# Patient Record
Sex: Male | Born: 1970 | Race: White | Hispanic: No | Marital: Married | Smoking: Never smoker
Health system: Southern US, Community
[De-identification: ages and names within clinical notes are randomized; demographics above are authoritative.]

## PROBLEM LIST (undated history)

## (undated) DIAGNOSIS — K429 Umbilical hernia without obstruction or gangrene: Secondary | ICD-10-CM

## (undated) DIAGNOSIS — F329 Major depressive disorder, single episode, unspecified: Secondary | ICD-10-CM

## (undated) DIAGNOSIS — I10 Essential (primary) hypertension: Secondary | ICD-10-CM

## (undated) DIAGNOSIS — K759 Inflammatory liver disease, unspecified: Secondary | ICD-10-CM

## (undated) DIAGNOSIS — K219 Gastro-esophageal reflux disease without esophagitis: Secondary | ICD-10-CM

## (undated) DIAGNOSIS — K449 Diaphragmatic hernia without obstruction or gangrene: Secondary | ICD-10-CM

## (undated) DIAGNOSIS — F419 Anxiety disorder, unspecified: Secondary | ICD-10-CM

## (undated) DIAGNOSIS — F32A Depression, unspecified: Secondary | ICD-10-CM

## (undated) HISTORY — PX: UMBILICAL HERNIA REPAIR: SHX196

---

## 2018-02-02 DIAGNOSIS — F32A Depression, unspecified: Secondary | ICD-10-CM | POA: Insufficient documentation

## 2018-02-17 DIAGNOSIS — Z0000 Encounter for general adult medical examination without abnormal findings: Secondary | ICD-10-CM | POA: Insufficient documentation

## 2018-06-19 ENCOUNTER — Encounter: Payer: Self-pay | Admitting: Emergency Medicine

## 2018-06-19 ENCOUNTER — Other Ambulatory Visit: Payer: Self-pay

## 2018-06-19 ENCOUNTER — Emergency Department
Admission: EM | Admit: 2018-06-19 | Discharge: 2018-06-19 | Disposition: A | Discharge status: Home / Self Care | Payer: BC Managed Care – PPO | Attending: Emergency Medicine | Admitting: Emergency Medicine

## 2018-06-19 DIAGNOSIS — B0052 Herpesviral keratitis: Secondary | ICD-10-CM | POA: Insufficient documentation

## 2018-06-19 DIAGNOSIS — H5711 Ocular pain, right eye: Secondary | ICD-10-CM

## 2018-06-19 HISTORY — DX: Major depressive disorder, single episode, unspecified: F32.9

## 2018-06-19 HISTORY — DX: Anxiety disorder, unspecified: F41.9

## 2018-06-19 HISTORY — DX: Depression, unspecified: F32.A

## 2018-06-19 MED ORDER — CIPROFLOXACIN HCL 0.3 % OP SOLN: 1.0000 [drp] | OPHTHALMIC | 0 refills | Status: AC

## 2018-06-19 MED ORDER — TETRACAINE HCL 0.5 % OP SOLN
2.0000 [drp] | Freq: Once | OPHTHALMIC | Status: AC
  Administered 2018-06-19: 2 [drp] via OPHTHALMIC
  Filled 2018-06-19: qty 4

## 2018-06-19 MED ORDER — FLUORESCEIN SODIUM 1 MG OP STRP
1.0000 | ORAL_STRIP | Freq: Once | OPHTHALMIC | Status: AC
  Administered 2018-06-19: 1 via OPHTHALMIC
  Filled 2018-06-19: qty 1

## 2018-06-19 NOTE — ED Notes (Signed)
Pt reports some pain behind his right eye that started on yesterday. Usually wears contacts, but is wearing his glasses today.

## 2018-06-19 NOTE — ED Provider Notes (Signed)
[facility] Emergency Department Provider Note  ____________________________________________  Time seen: Approximately 8:47 PM  I have reviewed the triage vital signs and the nursing notes.   HISTORY  Chief Complaint Eye Pain    HPI Nathan Moody is a 47 y.o. male who presents the emergency department complaining of right eye pain.  Patient reports that yesterday he had a sensation of a burning that he thought was related to his contacts.  Patient took out his contacts started wearing his glasses.  Patient reports that the pain is progressed to include his whole eye as well as "behind it."  Patient denies any vision changes to include loss of vision or blurred vision.  Patient has had no erythema or drainage from the eye.  Patient does have a history of migraines and states that the pain is similar, but he is never had one behind his eyes.  No headache, visual changes, nasal congestion, sinus pressure, URI symptoms.    Past Medical History:  Diagnosis Date  . Anxiety   . Depression     There are no active problems to display for this patient.   History reviewed. No pertinent surgical history.  Prior to Admission medications   Medication Sig Start Date End Date Taking? Authorizing Provider  ciprofloxacin (CILOXAN) 0.3 % ophthalmic solution Place 1 drop into the right eye every 2 (two) hours for 5 days. Administer 1 drop, every 2 hours, while awake, for 2 days. Then 1 drop, every 4 hours, while awake, for the next 5 days. 06/19/18 06/24/18  Christian Treadway, Charline Bills, PA-C    Allergies Patient has no known allergies.  History reviewed. No pertinent family history.  Social History Social History   Tobacco Use  . Smoking status: Never Smoker  . Smokeless tobacco: Never Used  Substance Use Topics  . Alcohol use: Never    Frequency: Never  . Drug use: Never     Review of Systems  Constitutional: No fever/chills Eyes: No visual changes. No  discharge.  Positive for right eye pain ENT: No upper respiratory complaints. Cardiovascular: no chest pain. Respiratory: no cough. No SOB. Gastrointestinal: No abdominal pain.  No nausea, no vomiting.   Musculoskeletal: Negative for musculoskeletal pain. Skin: Negative for rash, abrasions, lacerations, ecchymosis. Neurological: Negative for headaches, focal weakness or numbness. 10-point ROS otherwise negative.  ____________________________________________   PHYSICAL EXAM:  VITAL SIGNS: ED Triage Vitals  Enc Vitals Group     BP 06/19/18 1946 132/88     Pulse Rate 06/19/18 1946 99     Resp 06/19/18 1946 18     Temp 06/19/18 1946 98.8 F (37.1 C)     Temp Source 06/19/18 1946 Oral     SpO2 06/19/18 1946 99 %     Weight 06/19/18 1946 210 lb (95.3 kg)     Height 06/19/18 1946 6' 1"  (1.854 m)     Head Circumference --      Peak Flow --      Pain Score 06/19/18 1951 9     Pain Loc --      Pain Edu? --      Excl. in [place]? --      Constitutional: Alert and oriented. Well appearing and in no acute distress. Eyes: Conjunctivae are normal. PERRL. EOMI. funduscopic exam bilaterally reveals good red reflex bilaterally.  Vasculature and optic disc is appreciated without acute abnormality.  No indication of retinal detachment.  Eye is anesthetized using tetracaine drops.  Fluorescein staining applied with small  area of uptake in the 9 o'clock position and 4 o'clock position.  Tono-Pen readings are obtained.  Readings are measured superiorly, medially, inferiorly, laterally respectively.  Readings for the right eye are as follows: 20, 9, 14, 17.  Readings for the left eye are as follows: 27, 13, 17, 19 Head: Atraumatic. ENT:      Ears:       Nose: No congestion/rhinnorhea.      Mouth/Throat: Mucous membranes are moist.  Neck: No stridor.    Cardiovascular: Normal rate, regular rhythm. Normal S1 and S2.  Good peripheral circulation. Respiratory: Normal respiratory effort without tachypnea  or retractions. Lungs CTAB. Good air entry to the bases with no decreased or absent breath sounds. Musculoskeletal: Full range of motion to all extremities. No gross deformities appreciated. Neurologic:  Normal speech and language. No gross focal neurologic deficits are appreciated.  Skin:  Skin is warm, dry and intact. No rash noted. Psychiatric: Mood and affect are normal. Speech and behavior are normal. Patient exhibits appropriate insight and judgement.   ____________________________________________   LABS (all labs ordered are listed, but only abnormal results are displayed)  Labs Reviewed - No data to display ____________________________________________  EKG   ____________________________________________  RADIOLOGY   No results found.  ____________________________________________    PROCEDURES  Procedure(s) performed:    Procedures    Medications  fluorescein ophthalmic strip 1 strip (has no administration in time range)  tetracaine (PONTOCAINE) 0.5 % ophthalmic solution 2 drop (has no administration in time range)     ____________________________________________   INITIAL IMPRESSION / ASSESSMENT AND PLAN / ED COURSE  Pertinent labs & imaging results that were available during my care of the patient were reviewed by me and considered in my medical decision making (see chart for details).  Review of the [place] CSRS was performed in accordance of the [place] prior to dispensing any controlled drugs.      Patient's diagnosis is consistent with right eye pain/dendritic ulcer.  Patient presents emergency department complaining of right eye pain.  Initially, patient thought they were related to his contacts.  He removed these.  Pain continued.  Differential included corneal abrasion, dendritic ulcer, bacterial conjunctivitis, retinal detachment, vitreous humor detachment, optic nerve injury, optic nerve stroke, ocular migraine.  Patient has mild areas of uptake on the  eye concerning for dendritic ulcer given contact use.  In addition, I suspect that symptoms are likely ocular migraine related.  Patient will be given antibiotic eyedrops.  He is encouraged to use medications that he would normally use for migraines at home.  If symptoms change, worsen he is to return to the emergency department.  If symptoms do not improve with medications, he is to follow-up with ophthalmology..  Patient is given ED precautions to return to the ED for any worsening or new symptoms.     ____________________________________________  FINAL CLINICAL IMPRESSION(S) / ED DIAGNOSES  Final diagnoses:  Dendritic ulcer  Pain of right eye      NEW MEDICATIONS STARTED DURING THIS VISIT:  ED Discharge Orders         Ordered    ciprofloxacin (CILOXAN) 0.3 % ophthalmic solution  Every 2 hours     06/19/18 2053              This chart was dictated using voice recognition software/Dragon. Despite best efforts to proofread, errors can occur which can change the meaning. Any change was purely unintentional.    Darletta Moll, PA-C 06/19/18 2055  Nance Pear, MD 06/19/18 2104

## 2018-06-19 NOTE — ED Triage Notes (Signed)
Pt arrived to the ED from home for complaints of right eye pain. Pt states that he uses contact lenses and he believed that the pain was related to the contact lenses so he removed them and started to use his glasses without relief. Pt reports that the pain has gotten increasingly worse without vision changes. Pt is AOx4 in no apparent distress with no other accompanied symptoms during triage.

## 2018-06-30 DIAGNOSIS — E663 Overweight: Secondary | ICD-10-CM | POA: Insufficient documentation

## 2018-07-03 DIAGNOSIS — E782 Mixed hyperlipidemia: Secondary | ICD-10-CM | POA: Insufficient documentation

## 2018-09-29 DIAGNOSIS — F5104 Psychophysiologic insomnia: Secondary | ICD-10-CM | POA: Insufficient documentation

## 2019-01-08 DIAGNOSIS — K429 Umbilical hernia without obstruction or gangrene: Secondary | ICD-10-CM | POA: Insufficient documentation

## 2020-10-21 HISTORY — PX: UMBILICAL HERNIA REPAIR: SHX196

## 2021-04-27 ENCOUNTER — Ambulatory Visit (INDEPENDENT_AMBULATORY_CARE_PROVIDER_SITE_OTHER): Payer: BC Managed Care – PPO | Admitting: Surgery

## 2021-04-27 ENCOUNTER — Other Ambulatory Visit: Payer: Self-pay

## 2021-04-27 ENCOUNTER — Encounter: Payer: Self-pay | Admitting: Surgery

## 2021-04-27 VITALS — BP 129/94 | HR 76 | Temp 98.2°F | Ht 73.0 in | Wt 209.2 lb

## 2021-04-27 DIAGNOSIS — K449 Diaphragmatic hernia without obstruction or gangrene: Secondary | ICD-10-CM

## 2021-04-27 DIAGNOSIS — K439 Ventral hernia without obstruction or gangrene: Secondary | ICD-10-CM

## 2021-04-27 DIAGNOSIS — R1084 Generalized abdominal pain: Secondary | ICD-10-CM | POA: Diagnosis not present

## 2021-04-27 NOTE — Consult Note (Addendum)
Patient ID: Nathan Moody, male   DOB: 23-Sep-1970, 50 y.o.   MRN: [id number]  HPI Ferlando Poythress is a 50 y.o. male patient seen in consultation at the request of Dr. Lysle Pearl for recalcitrant reflux, hiatal hernia and recurrent incisional hernia. Reports that over the last few months he has began reflux symptoms.  This gets worse when he lays supine, alleviated by some PPI.  He denies any dysphagia.  About 6 months ago he had laparoscopic ventral hernia repair (epigastric and umbilical primary repair) by Dr. Marian Sorrow at [facility].  He also reports that shortly after his hernia surgery recurrent symptoms with abdominal wall pain as well bulging sensation. He was recently hospitalized for about a week at [facility] due to suicidal ideation and depression. I have personally reviewed the operative report that the hernia was fixed with Vicryl laparoscopically suture, no mesh used. ( Pt had was hesitant). Endorses intermittent pain, sharp mild-moderate. Pain worsening w valsalva So far he has not had endoscopic evaluation.  He did have a chest x-ray showing evidence of a hiatal hernia. He does have chronic cough. He is able to perform more than 4 METS of activity without any shortness of or chest pain. Currently denies any suicidal ideation. He drinks a couple beers per week.  Denies any drugs.  Recent CBC and CMP was completely normal   HPI  Past Medical History:  Diagnosis Date   Anxiety    Depression     Past Surgical History:  Procedure Laterality Date   UMBILICAL HERNIA REPAIR      History reviewed. No pertinent family history.  Social History Social History   Tobacco Use   Smoking status: Never   Smokeless tobacco: Never  Vaping Use   Vaping Use: Never used  Substance Use Topics   Alcohol use: Never   Drug use: Never    No Known Allergies  Current Outpatient Medications  Medication Sig Dispense Refill   buPROPion (WELLBUTRIN SR) 150 MG 12 hr tablet Take 150 mg by mouth 2 (two) times  daily.     busPIRone (BUSPAR) 5 MG tablet Take 5 mg by mouth 3 (three) times daily.     mirtazapine (REMERON) 30 MG tablet Take 30 mg by mouth at bedtime.     valACYclovir (VALTREX) 1000 MG tablet Take 1,000 mg by mouth 2 (two) times daily.     venlafaxine (EFFEXOR) 37.5 MG tablet Take 37.5 mg by mouth 2 (two) times daily.     No current facility-administered medications for this visit.     Review of Systems Full ROS  was asked and was negative except for the information on the HPI  Physical Exam Blood pressure (!) 129/94, pulse 76, temperature 98.2 F (36.8 C), temperature source Oral, height 6' 1"  (1.854 m), weight 209 lb 3.2 oz (94.9 kg), SpO2 98 %. CONSTITUTIONAL: NAD. EYES: Pupils are equal, round, , Sclera are non-icteric. EARS, NOSE, MOUTH AND THROAT: The oropharynx is clear. THe is wearing a mask, Hearing is intact to voice. LYMPH NODES:  Lymph nodes in the neck are normal. RESPIRATORY:  Lungs are clear. There is normal respiratory effort, with equal breath sounds bilaterally, and without pathologic use of accessory muscles. CARDIOVASCULAR: Heart is regular without murmurs, gallops, or rubs. GI: The abdomen is  soft, There is both epigastric hernia and umbilical hernias, recurrent and tender to palpation, reducible. There are no palpable masses. There is no hepatosplenomegaly. There are normal bowel sounds GU: Rectal deferred.   MUSCULOSKELETAL: Normal muscle strength  and tone. No cyanosis or edema.   SKIN: Turgor is good and there are no pathologic skin lesions or ulcers. NEUROLOGIC: Motor and sensation is grossly normal. Cranial nerves are grossly intact. PSYCH:  Oriented to person, place and time. Affect is normal.  Data Reviewed  I have personally reviewed the patient's imaging, laboratory findings and medical records.    Assessment/Plan 50 year old male with recalcitrant reflux associated with hiatal hernia.  We will start work-up to include a CT scan of the abdomen  pelvis and as well as endoscopic evaluation.  We will asked Dr. Lysle Pearl to complete endoscopic evaluation in the form of upper scopes. In addition to this CT does have a recurrent epigastric and periumbilical ventral hernias that are symptomatic and will need to be addressed at some point time.  I would like to wait for pertinent imaging studies to evaluate the extent of abdominal wall hernias and whether or not he will be a feasible option to address the 3 hernias during the same operative setting. Extensive discussion with patient regarding his disease process and also my thought process was held. A copy of this report was sent to the referring provider  Time spent in this encounter was > 60 minutes including personally reviewing the operative reports, personally reviewing imaging studies, ordering test, counseling the patient and performing appropriate documentation  Caroleen Hamman, MD FACS General Surgeon 04/27/2021, 11:22 AM

## 2021-04-27 NOTE — Patient Instructions (Addendum)
We will refer you back to Dr Lysle Pearl to have an Upper Endoscopy done, he can also do the Colonoscopy at that time. They will call you about this.   We will get you scheduled for a CT scan of your abdomen and a swallow study done to better assess your hernias.  We will have you return here to discuss the next steps once we have all of your results.    You are scheduled for a Barium Swallow study at [facility] on 05/04/21. You will need to have nothing to eat or drink for 3 hours prior to the test. You arrival time is at 7:45 am at the [facility] entrance.   You are scheduled for a CT scan with contrast at [facility] on November 23rd at 11:30 am. You will need to have nothing to eat or drink for 4 hours prior. You need to arrive by there by 11:15 am You will need to pick up a prep kit for this test-you may get it there.    We have spoken today about repairing your Hiatal Hernia.  Plan to be in the hospital for 1-3 days if the minimally invasive surgery is completed without having to make a bigger incision. If the bigger incision is made, you will most likely need to be in the hospital 5-6 days. You will be on a liquid diet and need to recover for 1-2 weeks following your surgery prior to doing any of your normal activities. At the 2 week mark, we will see you in the office and if you are doing ok we will advance your diet and activity level as you tolerate.    Laparoscopic Nissen Fundoplication Laparoscopic Nissen fundoplication is surgery to relieve heartburn and other problems caused by gastric fluids flowing up into your esophagus. The esophagus is the tube that carries food and liquid from your throat to your stomach. Normally, the muscle that sits between your stomach and your esophagus (lower esophageal sphincter or LES) keeps stomach fluids in your stomach. In some people, the LES does not work properly, and stomach fluids flow up into the esophagus. This can happen when part of  the stomach bulges through the LES (hiatal hernia). The backward flow of stomach fluids can cause a type of severe and long-standing heartburn that is called gastroesophageal reflux disease (GERD). You may need this surgery if other treatments for GERD have not helped. In a laparoscopic Nissen fundoplication, the upper part of your stomach is wrapped around the lower part of your esophagus to strengthen the LES and prevent reflux. If you have a hiatal hernia, it will also be repaired with this surgery. The procedure is done through several small incisions in your abdomen. It is performed using a thin, telescopic instrument (laparoscope) and other instruments that can pass through the scope or through other small incisions. Tell a health care provider about: Any allergies you have. All medicines you are taking, including vitamins, herbs, eye drops, creams, and over-the-counter medicines. Any problems you or family members have had with anesthetic medicines. Any blood disorders you have. Any surgeries you have had. Any medical conditions you have. What are the risks? Generally, this is a safe procedure. However, problems may occur, including: Difficulty swallowing (dysphagia). Bloating. Nausea or vomiting. Damage to the lung, causing a collapsed lung. Infection or bleeding. What happens before the procedure? Ask your health care provider about: Changing or stopping your regular medicines. This is especially important if you are taking diabetes medicines  or blood thinners. Taking medicines such as aspirin and ibuprofen. These medicines can thin your blood. Do not take these medicines before your procedure if your health care provider asks you not to. Follow your health care provider's instructions about eating or drinking restrictions. Plan to have someone take you home after the procedure. What happens during the procedure? An IV tube will be inserted into one of your veins. It will be used to  give you fluids and medicines during the procedure. You will be given a medicine that makes you fall asleep (general anesthetic). Your abdomen will be cleaned with a germ-killing solution (antiseptic). The surgeon will make a small incision in your abdomen and insert a tube through the incision. Your abdomen will be filled with a gas. This helps the surgeon to see your organs more easily and it makes more space to work. The surgeon will insert the laparoscope through the incision. The scope has a camera that will send pictures to a monitor in the operating room. The surgeon will make several other small incisions in your abdomen to insert the other instruments that are needed during the procedure. Another instrument (dilator) will be passed through your mouth and down your esophagus into the upper part of your stomach. The dilator will prevent your LES from being closed too tightly during surgery. The surgeon will pass the top portion of your stomach behind the lower part of your esophagus and wrap it all the way around. This will be stitched into place. If you have a hiatal hernia, it will be repaired during this procedure. All instruments will be removed, and your incisions will be closed under your skin with stitches (sutures). Skin adhesive strips may also be used. A bandage (dressing) will be placed on your skin over the incisions. The procedure may vary among health care providers and hospitals. What happens after the procedure? You will be moved to a recovery area. Your blood pressure, heart rate, breathing rate, and blood oxygen level will be monitored often until the medicines you were given have worn off. You will be given pain medicine as needed. Your IV tube will be kept in until you are able to drink fluids. This information is not intended to replace advice given to you by your health care provider. Make sure you discuss any questions you have with your health care provider. Document  Released: 07/05/2014 Document Revised: 11/20/2015 Document Reviewed: 02/13/2014 Elsevier Interactive Patient Education  2017 [employer].

## 2021-05-04 ENCOUNTER — Other Ambulatory Visit: Payer: Self-pay | Admitting: Surgery

## 2021-05-04 ENCOUNTER — Ambulatory Visit
Admission: RE | Admit: 2021-05-04 | Discharge: 2021-05-04 | Disposition: A | Discharge status: Home / Self Care | Payer: BC Managed Care – PPO | Source: Ambulatory Visit | Attending: Surgery | Admitting: Surgery

## 2021-05-04 DIAGNOSIS — K449 Diaphragmatic hernia without obstruction or gangrene: Secondary | ICD-10-CM

## 2021-05-20 ENCOUNTER — Ambulatory Visit
Admission: RE | Admit: 2021-05-20 | Discharge: 2021-05-20 | Disposition: A | Discharge status: Home / Self Care | Payer: BC Managed Care – PPO | Source: Ambulatory Visit | Attending: Surgery | Admitting: Surgery

## 2021-05-20 ENCOUNTER — Other Ambulatory Visit: Payer: Self-pay

## 2021-05-20 DIAGNOSIS — K449 Diaphragmatic hernia without obstruction or gangrene: Secondary | ICD-10-CM | POA: Diagnosis present

## 2021-05-20 DIAGNOSIS — K439 Ventral hernia without obstruction or gangrene: Secondary | ICD-10-CM | POA: Diagnosis present

## 2021-05-20 DIAGNOSIS — R1084 Generalized abdominal pain: Secondary | ICD-10-CM | POA: Diagnosis present

## 2021-05-20 MED ORDER — IOHEXOL 300 MG/ML  SOLN
100.0000 mL | Freq: Once | INTRAMUSCULAR | Status: AC | PRN
  Administered 2021-05-20: 100 mL via INTRAVENOUS

## 2021-05-25 ENCOUNTER — Telehealth: Payer: Self-pay

## 2021-05-25 ENCOUNTER — Ambulatory Visit: Payer: BC Managed Care – PPO | Admitting: Surgery

## 2021-05-25 NOTE — Telephone Encounter (Signed)
Spoke with patient regarding CT results and reminded to keep follow up appointment with Dr.Pabon.

## 2021-05-26 DIAGNOSIS — I10 Essential (primary) hypertension: Secondary | ICD-10-CM | POA: Insufficient documentation

## 2021-05-27 ENCOUNTER — Ambulatory Visit (INDEPENDENT_AMBULATORY_CARE_PROVIDER_SITE_OTHER): Payer: BC Managed Care – PPO | Admitting: Surgery

## 2021-05-27 ENCOUNTER — Encounter: Payer: Self-pay | Admitting: Surgery

## 2021-05-27 ENCOUNTER — Other Ambulatory Visit: Payer: Self-pay

## 2021-05-27 VITALS — BP 142/97 | HR 86 | Temp 98.0°F | Ht 73.0 in | Wt 231.0 lb

## 2021-05-27 DIAGNOSIS — K449 Diaphragmatic hernia without obstruction or gangrene: Secondary | ICD-10-CM | POA: Diagnosis not present

## 2021-05-27 DIAGNOSIS — K432 Incisional hernia without obstruction or gangrene: Secondary | ICD-10-CM

## 2021-05-27 NOTE — Patient Instructions (Addendum)
Our surgery scheduler will call you within 24-48 hours to schedule your surgery. Please have the [facility] surgery sheet available when speaking with her. No bread or Carbonation for 4 weeks.  Eating Plan After Nissen Fundoplication After a Nissen fundoplication procedure, it is common to have some difficulty swallowing. The part of your body that moves food and liquid from your mouth to your stomach (esophagus) will be swollen and may feel tight. It will take several weeks or months for your esophagus and stomach to heal. By following a special eating plan, you can prevent problems such as pain, swelling or pressure in the abdomen (bloating), gas, nausea, or diarrhea. What are tips for following this plan? Cooking Cook all foods until they are soft. Remove skins and seeds from fruits and vegetables before eating. Remove skin and gristle from meats. Grind or finely mince meats before eating. Avoid over-cooking meat. Dry, tough meat is more difficult to swallow. Avoid using oil when cooking, or use only a small amount of oil. Avoid using seasoning when cooking, or use only a small amount of seasoning. Toast bread before eating. This makes it easier to swallow. Meal planning  Eat 6-8 small meals throughout the day. Right after the surgery, have a few meals that are only clear liquids. Clear liquids include: Water. Clear fruit juice, no pulp. Chicken, beef, or vegetable broth. Gelatin. Decaffeinated tea or coffee without milk. Popsicles or shaved ice. Depending on your progress, you may move to a full liquid diet as told by your health care provider. This includes clear liquids and the following: Dairy and alternative milks, such as soy milk. Strained creamed soups. Ice cream or sherbet. Pudding. Nutritional supplement drinks. Yogurt. A few days after surgery, you may be able to start eating a diet of soft foods. You may need to eat according to this plan for several weeks. Do not eat sweets  or sweetened drinks at the beginning of a meal. Doing that may cause your stomach to empty faster than it should (dumping syndrome). Lifestyle Always sit upright when eating or drinking. Eat slowly. Take small bites and chew food well before swallowing. Do not lie down after eating. Stay sitting up for 30 minutes or longer after each meal. Sip fluids between meals. Limit how much you drink at one time. With meals and snacks, have 4-8 oz (120-240 mL). This is equal to  cup-1 cup. Do not mix solid foods and liquids in the same mouthful. Drink enough fluid to keep your urine pale yellow. Do not chew gum or drink fluids through a straw. Doing those things may cause you to swallow extra air. General information Do not drink carbonated drinks or alcohol. Avoid foods and drinks that contain caffeine and chocolate. Avoid foods and drinks that contain citrus or tomato. Allow hot soups and drinks to cool before eating. Avoid foods that cause gas, such as beans, peas, broccoli, or cabbage. If dairy milk products cause diarrhea, avoid them or eat them in small amounts. Recommended foods Fruits Any soft-cooked fruits after skins and seeds are removed. Fruit juice. Vegetables Any soft-cooked vegetables after skins and seeds are removed. Vegetable juice. Grains Cooked cereals. Dry cereals softened with liquid. Cooked pasta, rice, or other grains. Toasted bread. Bland crackers, such as soda or graham crackers. Meats and other protein foods Tender cuts of meat, poultry, or fish after bones, skin, and gristle are removed. Poached, boiled, or scrambled eggs. Canned fish. Tofu. Creamy nut butters. Dairy Milk. Yogurt. Cottage cheese. Mild  cheeses. Beverages Nutritional supplement drinks. Decaffeinated tea or coffee. Sports drinks. Fats and oils Butter. Margarine. Mayonnaise. Vegetable oil. Smooth salad dressing. Sweets and desserts Plain hard candy. Marshmallows. Pudding. Ice cream. Gelatin.  Sherbet. Seasoning and other foods Salt. Light seasonings. Mustard. Vinegar. The items listed above may not be a complete list of recommended foods and beverages. Contact a dietitian for more information. Foods to avoid Fruits Oranges. Grapefruit. Lemons. Limes. Citrus juices. Dried fruit. Crunchy, raw fruits. Vegetables Tomato sauce. Tomato juice. Broccoli. Cauliflower. Cabbage. Brussels sprouts. Crunchy, raw vegetables. Grains High-fiber or bran cereal. Cereal with nuts, dried fruit, or coconut. Sweet breads, rolls, coffee cake, or donuts. Chewy or crusty breads. Popcorn. Meats and other protein foods Beans, peas, and lentils. Tough or fatty meats. Fried meats, chicken, or fish. Fried eggs. Nuts and seeds. Crunchy nut butters. Dairy Chocolate milk. Yogurt with chunks of fruit, nuts, seeds, or coconut. Strong cheeses. Beverages Carbonated soft drinks. Alcohol. Cocoa. Hot drinks. Fats and oils Bacon fat. Lard. Sweets and desserts Chocolate. Candy with nuts, coconut, or seeds. Peppermint. Cookies. Cakes. Pie crust. Seasoning and other foods Heavy seasonings. Chili sauce. Ketchup. Barbecue sauce. Angie Fava. Horseradish. The items listed above may not be a complete list of foods and beverages to avoid. Contact a dietitian for more information. Summary Following this eating plan after a Nissen fundoplication is an important part of healing after surgery. After surgery, you will start with a clear liquid diet before you progress to full liquids and soft foods. You may need to eat soft foods for several weeks. Avoid eating foods that cause irritation, gas, nausea, diarrhea, or swelling or pressure in the abdomen (bloating), and avoid foods that are difficult to swallow. Talk with a dietitian about which dietary choices are best for you. This information is not intended to replace advice given to you by your health care provider. Make sure you discuss any questions you have with your health care  provider. Document Revised: 12/30/2019 Document Reviewed: 12/30/2019 Elsevier Patient Education  2022 [place], Adult   A hernia is the bulging of an organ or tissue through a weak spot in the muscles of the abdomen. Hernias develop most often near the belly button (navel) or the area where the leg meets the lower abdomen (groin). Common types of hernias include: Incisional hernia. This type bulges through a scar from an abdominal surgery. Umbilical hernia. This type develops near the navel. Inguinal hernia. This type develops in the groin or scrotum. Femoral hernia. This type develops below the groin, in the upper thigh area. Hiatal hernia. This type occurs when part of the stomach slides above the muscle that separates the abdomen from the chest (diaphragm). What are the causes? This condition may be caused by: Heavy lifting. Coughing over a long period of time. Straining to have a bowel movement. Constipation can lead to straining. An incision made during abdominal surgery. A physical problem that is present at birth (congenital defect). Being overweight or obese. Smoking. Excess fluid in the abdomen. Undescended testicles in males. What are the signs or symptoms? The main symptom is a skin-colored, rounded bulge in the area of the hernia. However, a bulge may not always be present. It may grow bigger or be more visible when you cough or strain (such as when lifting something heavy). A hernia that can be pushed back into the abdomen (is reducible) rarely causes pain. A hernia that cannot be pushed back into the abdomen (is incarcerated) may lose its blood supply (  become strangulated). A hernia that is incarcerated may cause: Pain. Fever. Nausea and vomiting. Swelling. Constipation. How is this diagnosed? A hernia may be diagnosed based on: Your symptoms and medical history. A physical exam. Your health care provider may ask you to cough or move in certain ways to see  if the hernia becomes visible. Imaging tests, such as: X-rays. Ultrasound. CT scan. How is this treated? A hernia that is small and painless may not need to be treated. A hernia that is large or painful may be treated with surgery. Inguinal hernias may be treated with surgery to prevent incarceration or strangulation. Strangulated hernias are always treated with surgery because the strangulation causes a lack of blood supply to the trapped organ or tissue. Surgery to treat a hernia involves pushing the bulge back into place and repairing the weak area of the muscle or abdominal wall. Follow these instructions at home: Activity Avoid straining. Do not lift anything that is heavier than 10 lb (4.5 kg), or the limit that you are told, until your health care provider says that it is safe. When lifting heavy objects, lift with your leg muscles, not your back muscles. Preventing constipation Take actions to prevent constipation. Constipation leads to straining with bowel movements, which can make a hernia worse or cause a hernia repair to break down. Your health care provider may recommend that you take these actions to prevent or treat constipation: Drink enough fluid to keep your urine pale yellow. Take over-the-counter or prescription medicines. Eat foods that are high in fiber, such as beans, whole grains, and fresh fruits and vegetables. Limit foods that are high in fat and processed sugars, such as fried or sweet foods. General instructions When coughing, try to cough gently. You may try to push the hernia back in place by very gently pressing on it while lying down. Do not try to force the bulge back in if it will not push in easily. If you are overweight, work with your health care provider to lose weight safely. Do not use any products that contain nicotine or tobacco. These products include cigarettes, chewing tobacco, and vaping devices, such as e-cigarettes. If you need help quitting,  ask your health care provider. If you are scheduled for hernia repair, watch your hernia for any changes in shape, size, or color. Tell your health care provider about any changes or new symptoms. Take over-the-counter and prescription medicines only as told by your health care provider. Keep all follow-up visits. This is important. Contact a health care provider if: You develop new pain, swelling, or redness around your hernia. You have signs of constipation, such as: Fewer bowel movements in a week than normal. Difficulty having a bowel movement. Stools that are dry, hard, or larger than normal. Get help right away if: You have a fever or chills. You have abdominal pain that gets worse. You feel nauseous or you vomit. You cannot push the hernia back in place by very gently pressing on it while lying down. Do not try to force the bulge back in if it will not go in easily. The hernia: Changes in shape, size, or color. Feels hard or tender. These symptoms may represent a serious problem that is an emergency. Do not wait to see if the symptoms will go away. Get medical help right away. Call your local emergency services (911 in the [place] Do not drive yourself to the hospital. Summary A hernia is the bulging of an organ or tissue through  a weak spot in the muscles of the abdomen. The main symptom is a skin-colored bulge in the hernia area. However, a bulge may not always be present. It may grow bigger or more visible when you cough or strain (such as when having a bowel movement). A hernia that is small and painless may not need to be treated. A hernia that is large or painful may be treated with surgery. Surgery to treat a hernia involves pushing the bulge back into place and repairing the weak part of the abdomen. This information is not intended to replace advice given to you by your health care provider. Make sure you discuss any questions you have with your health care provider. Document  Revised: 01/21/2020 Document Reviewed: 01/21/2020 Elsevier Patient Education  2022 [place].     Laparoscopic Nissen Fundoplication Laparoscopic Nissen fundoplication is a surgery to relieve heartburn and other problems caused by fluid from your stomach (gastric fluids) flowing up into your esophagus. The esophagus is the part of the body that moves food from the mouth to the stomach. Normally, the muscle that sits between the stomach and the esophagus (lower esophageal sphincter, LES) keeps stomach fluids in the stomach. In some people, the LES does not work properly, and stomach fluids flow up into the esophagus (reflux). This can happen when part of the stomach bulges through the LES (hiatal hernia). The backward flow of stomach fluids can cause a type of severe and long-lasting heartburn that is called gastroesophageal reflux disease (GERD). You may need this surgery if other treatments for GERD have not helped. In this procedure, the upper part of your stomach is wrapped around the lower end of your esophagus and stitched together (sutured). This tightens the connection between your esophagus and stomach to prevent stomach acid reflux. Tell a health care provider about: Any allergies you have. All medicines you are taking, including vitamins, herbs, eye drops, creams, and over-the-counter medicines. Any problems you or family members have had with anesthetic medicines. Any blood disorders you have. Any surgeries you have had. Any medical conditions you have. Whether you are pregnant or may be pregnant. What are the risks? Generally, this is a safe procedure. However, problems may occur, including: Infection. Bleeding. Damage to other structures or organs. This can include damage to the lung, causing a collapsed lung. Trouble swallowing (dysphagia). Blood clots. Allergic reactions to medicines. What happens before the procedure? Staying hydrated Follow instructions from your  health care provider about hydration, which may include: Up to two hours before the procedure - you may continue to drink clear liquids, such as water, clear fruit juice, black coffee and plain tea. Eating and drinking restrictions Follow instructions from your health care provider about eating and drinking, which may include: 8 hours before the procedure - stop eating heavy meals or foods, such as meat, fried foods, or fatty foods. 6 hours before the procedure - stop eating light meals or foods, such as toast or cereal. 6 hours before the procedure - stop drinking milk or drinks that contain milk. 2 hours before the procedure - stop drinking clear liquids. Medicines Ask your health care provider about: Changing or stopping your regular medicines. This is especially important if you are taking diabetes medicines or blood thinners. Taking medicines such as aspirin and ibuprofen. These medicines can thin your blood. Do not take these medicines unless your health care provider tells you to take them. Taking over-the-counter medicines, vitamins, herbs, and supplements. Tests Your health care provider  will do tests to plan the procedure. This may include: An exam using a flexible scope passed down your esophagus into your stomach (endoscopy). Imaging studies. General instructions Plan to have a responsible adult take you home from the hospital or clinic. Ask your health care provider: How your surgery site will be marked. What steps will be taken to help prevent infection. These steps may include: Removing hair at the surgery site. Washing skin with a germ-killing soap. Taking antibiotic medicine. Do not use any products that contain nicotine or tobacco for at least 4 weeks before the procedure. These products include cigarettes, chewing tobacco, and vaping devices, such as e-cigarettes. If you need help quitting, ask your health care provider. What happens during the procedure? An IV will be  inserted into one of your veins. You will be given medicine in your IV to help you relax (sedative) just before the procedure and a medicine to make you fall asleep (general anesthetic). You may have a tube placed through your nose into your stomach to drain stomach acid during the procedure (nasogastric tube). The surgeon will make a small incision in your abdomen and insert a tube through the incision. Your abdomen will be filled with a gas. This helps the surgeon see your organs better, and it makes more space to work. The surgeon will insert a thin, lighted tube (laparoscope) through the small incision. This allows your surgeon to see into your abdomen. The surgeon will make several other small incisions in your abdomen to insert the other instruments that are needed during the procedure. Another instrument (dilator) will be passed through your mouth and down your esophagus into the upper part of your stomach. The dilator will prevent your LES from being closed too tightly during surgery. The upper part of your stomach will be wrapped around the lower part of your esophagus and will be stitched into place. This will strengthen the lower esophageal sphincter and prevent reflux. If you have a hiatal hernia, it will be repaired. The gas will be released from your abdomen. All instruments will be removed, and the incisions will be closed with stitches (sutures). A bandage (dressing) will be placed on your skin over the incisions. The procedure may vary among health care providers and hospitals. What happens after the procedure? Your blood pressure, heart rate, breathing rate, and blood oxygen level will be monitored until you leave the hospital or clinic. You will be given pain medicine as needed. Your IV will be kept in until you are able to drink fluids. You will be encouraged to get up and walk around as soon as possible. Summary Laparoscopic Nissen fundoplication is a surgery to relieve  heartburn and other problems caused by gastric fluids flowing up into your esophagus. You may need this surgery if other treatments for GERD have not helped. Follow instructions from your health care provider about eating and drinking before the procedure. Your surgeon will use a thin, lighted tube (laparoscope) that is inserted through a small incision, allowing the surgeon to see into your abdomen. This information is not intended to replace advice given to you by your health care provider. Make sure you discuss any questions you have with your health care provider. Document Revised: 12/30/2019 Document Reviewed: 12/30/2019 Elsevier Patient Education  [employer].

## 2021-05-27 NOTE — H&P (View-Only) (Signed)
Outpatient Surgical Follow Up  05/27/2021  Nathan Moody is an 50 y.o. male.   Chief Complaint  Patient presents with   Follow-up    Hiatal hernia    HPI: Nathan Moody is a 50 y.o. male patient seen in consultation at the request of Dr. Lysle Moody for recalcitrant reflux, hiatal hernia and recurrent incisional hernia. Reports that over the last few months he has began reflux symptoms.  This gets worse when he lays supine, alleviated by some PPI.  He denies any dysphagia.  About 6 months ago he had laparoscopic ventral hernia repair (epigastric and umbilical primary repair) by Dr. Marian Moody at [facility].  He also reports that shortly after his hernia surgery recurrent symptoms with abdominal wall pain as well bulging sensation. He was recently hospitalized for about a week at [facility] due to suicidal ideation and depression. I have personally reviewed the operative report that the hernia was fixed with Vicryl laparoscopically suture, no mesh used. ( Pt was hesitant about mesh). Endorses intermittent pain, sharp mild-moderate. Pain worsening w valsalva So far he has not had endoscopic evaluation.  He did have a chest x-ray showing evidence of a hiatal hernia. He does have chronic cough. He is able to perform more than 4 METS of activity without any shortness of or chest pain. Currently denies any suicidal ideation.   He completed his work-up to include a CT scan as well as a barium swallow.  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  There is indentation evidence of an epigastric and a separate umbilical hernia.  No other acute abnormalities.  He did have an EGD by Dr. Alice Moody that I have personally reviewed showing evidence of hiatal hernia.  No other acute endoluminal abnormalities.   Past Medical History:  Diagnosis Date   Anxiety    Depression     Past Surgical History:  Procedure Laterality Date   UMBILICAL HERNIA REPAIR      History reviewed. No pertinent family  history.  Social History:  reports that he has never smoked. He has never used smokeless tobacco. He reports that he does not drink alcohol and does not use drugs.  Allergies: No Known Allergies  Medications reviewed.    ROS Full ROS performed and is otherwise negative other than what is stated in HPI   BP (!) 142/97   Pulse 86   Temp 98 F (36.7 C) (Oral)   Ht 6' 1"  (1.854 m)   Wt 231 lb (104.8 kg)   SpO2 99%   BMI 30.48 kg/m   Physical Exam CONSTITUTIONAL: NAD. EYES: Pupils are equal, round, , Sclera are non-icteric. EARS, NOSE, MOUTH AND THROAT: The oropharynx is clear. THe is wearing a mask, Hearing is intact to voice. LYMPH NODES:  Lymph nodes in the neck are normal. RESPIRATORY:  Lungs are clear. There is normal respiratory effort, with equal breath sounds bilaterally, and without pathologic use of accessory muscles. CARDIOVASCULAR: Heart is regular without murmurs, gallops, or rubs. GI: The abdomen is  soft, There is both epigastric hernia and umbilical hernias, recurrent and tender to palpation, reducible. There are no palpable masses. There is no hepatosplenomegaly. There are normal bowel sounds GU: Rectal deferred.   MUSCULOSKELETAL: Normal muscle strength and tone. No cyanosis or edema.   SKIN: Turgor is good and there are no pathologic skin lesions or ulcers. NEUROLOGIC: Motor and sensation is grossly normal. Cranial nerves are grossly intact. PSYCH:  Oriented to person, place and time. Affect is normal.  Assessment/Plan: 50 year old male with type III paraesophageal hernia repair that is symptomatic as well has 1 incisional hernia in the supraumbilical region and another separate infraumbilical hernia.  Discussed with this patient in detail about the disease process.  I do think that we will be able to tackle the recurrent incisional hernia as well as the other umbilical hernia in addition to the paraesophageal hernia in 1 operative setting.  We will plan to  perform a robotic paraesophageal hernia repair with mesh in addition to a fundoplication 191 degrees and the abdominal wall hernias.  Procedures discussed with patient in detail.  Risks, benefits and possible occasions including but not limited to: Bleeding, infection injury to his adjacent structures, chronic pain, esophageal injuries, bloating and dysphagia.  We also discussed with him in detail about Nissen fortification diet.  Please note that I spent more than 45 minutes in this encounter including personally reviewing imaging studies, counseling patient, placing orders and performing appropriate documentation  Caroleen Hamman, MD [facility]

## 2021-05-27 NOTE — Progress Notes (Signed)
Outpatient Surgical Follow Up  05/27/2021  Nathan Moody is an 50 y.o. male.   Chief Complaint  Patient presents with   Follow-up    Hiatal hernia    HPI: Nathan Moody is a 50 y.o. male patient seen in consultation at the request of Dr. Lysle Pearl for recalcitrant reflux, hiatal hernia and recurrent incisional hernia. Reports that over the last few months he has began reflux symptoms.  This gets worse when he lays supine, alleviated by some PPI.  He denies any dysphagia.  About 6 months ago he had laparoscopic ventral hernia repair (epigastric and umbilical primary repair) by Dr. Marian Sorrow at [facility].  He also reports that shortly after his hernia surgery recurrent symptoms with abdominal wall pain as well bulging sensation. He was recently hospitalized for about a week at [facility] due to suicidal ideation and depression. I have personally reviewed the operative report that the hernia was fixed with Vicryl laparoscopically suture, no mesh used. ( Pt was hesitant about mesh). Endorses intermittent pain, sharp mild-moderate. Pain worsening w valsalva So far he has not had endoscopic evaluation.  He did have a chest x-ray showing evidence of a hiatal hernia. He does have chronic cough. He is able to perform more than 4 METS of activity without any shortness of or chest pain. Currently denies any suicidal ideation.   He completed his work-up to include a CT scan as well as a barium swallow.  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  There is indentation evidence of an epigastric and a separate umbilical hernia.  No other acute abnormalities.  He did have an EGD by Dr. Alice Reichert that I have personally reviewed showing evidence of hiatal hernia.  No other acute endoluminal abnormalities.   Past Medical History:  Diagnosis Date   Anxiety    Depression     Past Surgical History:  Procedure Laterality Date   UMBILICAL HERNIA REPAIR      History reviewed. No pertinent family  history.  Social History:  reports that he has never smoked. He has never used smokeless tobacco. He reports that he does not drink alcohol and does not use drugs.  Allergies: No Known Allergies  Medications reviewed.    ROS Full ROS performed and is otherwise negative other than what is stated in HPI   BP (!) 142/97   Pulse 86   Temp 98 F (36.7 C) (Oral)   Ht 6' 1"  (1.854 m)   Wt 231 lb (104.8 kg)   SpO2 99%   BMI 30.48 kg/m   Physical Exam CONSTITUTIONAL: NAD. EYES: Pupils are equal, round, , Sclera are non-icteric. EARS, NOSE, MOUTH AND THROAT: The oropharynx is clear. THe is wearing a mask, Hearing is intact to voice. LYMPH NODES:  Lymph nodes in the neck are normal. RESPIRATORY:  Lungs are clear. There is normal respiratory effort, with equal breath sounds bilaterally, and without pathologic use of accessory muscles. CARDIOVASCULAR: Heart is regular without murmurs, gallops, or rubs. GI: The abdomen is  soft, There is both epigastric hernia and umbilical hernias, recurrent and tender to palpation, reducible. There are no palpable masses. There is no hepatosplenomegaly. There are normal bowel sounds GU: Rectal deferred.   MUSCULOSKELETAL: Normal muscle strength and tone. No cyanosis or edema.   SKIN: Turgor is good and there are no pathologic skin lesions or ulcers. NEUROLOGIC: Motor and sensation is grossly normal. Cranial nerves are grossly intact. PSYCH:  Oriented to person, place and time. Affect is normal.  Assessment/Plan: 50 year old male with type III paraesophageal hernia repair that is symptomatic as well has 1 incisional hernia in the supraumbilical region and another separate infraumbilical hernia.  Discussed with this patient in detail about the disease process.  I do think that we will be able to tackle the recurrent incisional hernia as well as the other umbilical hernia in addition to the paraesophageal hernia in 1 operative setting.  We will plan to  perform a robotic paraesophageal hernia repair with mesh in addition to a fundoplication 518 degrees and the abdominal wall hernias.  Procedures discussed with patient in detail.  Risks, benefits and possible occasions including but not limited to: Bleeding, infection injury to his adjacent structures, chronic pain, esophageal injuries, bloating and dysphagia.  We also discussed with him in detail about Nissen fortification diet.  Please note that I spent more than 45 minutes in this encounter including personally reviewing imaging studies, counseling patient, placing orders and performing appropriate documentation  Caroleen Hamman, MD [facility]geon

## 2021-05-28 ENCOUNTER — Telehealth: Payer: Self-pay | Admitting: Surgery

## 2021-05-28 NOTE — Telephone Encounter (Signed)
Patient can't do pre admit or Covid testing week prior to his surgery because he will be on a cruise ship.  So all will be on 06/08/21 for his preop and Covid testing in person.   Patient has been advised of Pre-Admission date/time, COVID Testing date and Surgery date.  Surgery Date: 06/09/21 Preadmission Testing Date: 06/08/21 @ 9:00 am in person.   Covid Testing Date: 06/08/21 @ 9:00 am - patient advised to go to the [facility] ([place]   Patient has been made aware to call [phone], between 1-3:00pm the day before surgery, to find out what time to arrive for surgery.

## 2021-06-04 ENCOUNTER — Other Ambulatory Visit: Payer: BC Managed Care – PPO

## 2021-06-05 ENCOUNTER — Other Ambulatory Visit: Payer: BC Managed Care – PPO

## 2021-06-08 ENCOUNTER — Encounter
Admission: RE | Admit: 2021-06-08 | Discharge: 2021-06-08 | Disposition: A | Discharge status: Home / Self Care | Payer: BC Managed Care – PPO | Source: Ambulatory Visit | Attending: Surgery | Admitting: Surgery

## 2021-06-08 ENCOUNTER — Other Ambulatory Visit: Payer: Self-pay

## 2021-06-08 VITALS — BP 131/92 | HR 77 | Temp 97.8°F | Resp 12 | Ht 73.0 in | Wt 232.0 lb

## 2021-06-08 DIAGNOSIS — Z01812 Encounter for preprocedural laboratory examination: Secondary | ICD-10-CM | POA: Insufficient documentation

## 2021-06-08 DIAGNOSIS — Z20822 Contact with and (suspected) exposure to covid-19: Secondary | ICD-10-CM

## 2021-06-08 HISTORY — DX: Essential (primary) hypertension: I10

## 2021-06-08 HISTORY — DX: Diaphragmatic hernia without obstruction or gangrene: K44.9

## 2021-06-08 HISTORY — DX: Umbilical hernia without obstruction or gangrene: K42.9

## 2021-06-08 NOTE — Patient Instructions (Addendum)
Your procedure is scheduled on: Tuesday, December 13 Report to the Registration Desk on the 1st floor of the [facility]. To find out your arrival time, please call [phone] between 1PM - 3PM on: Monday, December 12  REMEMBER: Instructions that are not followed completely may result in serious medical risk, up to and including death; or upon the discretion of your surgeon and anesthesiologist your surgery may need to be rescheduled.  Do not eat food after midnight the night before surgery.  No gum chewing, lozengers or hard candies.  You may however, drink CLEAR liquids up to 2 hours before you are scheduled to arrive for your surgery. Do not drink anything within 2 hours of your scheduled arrival time.  Clear liquids include: - water  - apple juice without pulp - gatorade (not RED, PURPLE, OR BLUE) - black coffee or tea (Do NOT add milk or creamers to the coffee or tea) Do NOT drink anything that is not on this list.  TAKE THESE MEDICATIONS THE MORNING OF SURGERY WITH A SIP OF WATER:  Amlodipine Bupropion Buspirone  One week prior to surgery: Stop Anti-inflammatories (NSAIDS) such as Advil, Aleve, Ibuprofen, Motrin, Naproxen, Naprosyn and Aspirin based products such as Excedrin, Goodys Powder, BC Powder. Stop ANY OVER THE COUNTER supplements until after surgery. You may however, continue to take Tylenol if needed for pain up until the day of surgery.  No Alcohol for 24 hours before or after surgery.  No Smoking including e-cigarettes for 24 hours prior to surgery.  No chewable tobacco products for at least 6 hours prior to surgery.  No nicotine patches on the day of surgery.  Do not use any "recreational" drugs for at least a week prior to your surgery.  Please be advised that the combination of cocaine and anesthesia may have negative outcomes, up to and including death. If you test positive for cocaine, your surgery will be cancelled.  On the morning of surgery brush  your teeth with toothpaste and water, you may rinse your mouth with mouthwash if you wish. Do not swallow any toothpaste or mouthwash.  Use CHG Soap as directed on instruction sheet.  Do not wear jewelry.  Do not wear lotions, powders, or perfumes.   Do not shave body from the neck down 48 hours prior to surgery just in case you cut yourself which could leave a site for infection.  Also, freshly shaved skin may become irritated if using the CHG soap.  Contact lenses, hearing aids and dentures may not be worn into surgery.  Do not bring valuables to the hospital. [facility] is not responsible for any missing/lost belongings or valuables.   Notify your doctor if there is any change in your medical condition (cold, fever, infection).  Wear comfortable clothing (specific to your surgery type) to the hospital.  After surgery, you can help prevent lung complications by doing breathing exercises.  Take deep breaths and cough every 1-2 hours. Your doctor may order a device called an Incentive Spirometer to help you take deep breaths. When coughing or sneezing, hold a pillow firmly against your incision with both hands. This is called "splinting." Doing this helps protect your incision. It also decreases belly discomfort.  If you are being admitted to the hospital overnight, leave your suitcase in the car. After surgery it may be brought to your room.  If you are being discharged the day of surgery, you will not be allowed to drive home. You will need a  responsible adult (18 years or older) to drive you home and stay with you that night.   If you are taking public transportation, you will need to have a responsible adult (18 years or older) with you. Please confirm with your physician that it is acceptable to use public transportation.   Please call the [place] Dept[facility] at [phone] if you have any questions about these instructions.  Surgery Visitation Policy:  Patients  undergoing a surgery or procedure may have one family member or support person with them as long as that person is not COVID-19 positive or experiencing its symptoms.  That person may remain in the waiting area during the procedure and may rotate out with other people.  Inpatient Visitation:    Visiting hours are 7 a.m. to 8 p.m. Up to two visitors ages 16+ are allowed at one time in a patient room. The visitors may rotate out with other people during the day. Visitors must check out when they leave, or other visitors will not be allowed. One designated support person may remain overnight. The visitor must pass COVID-19 screenings, use hand sanitizer when entering and exiting the patient's room and wear a mask at all times, including in the patient's room. Patients must also wear a mask when staff or their visitor are in the room. Masking is required regardless of vaccination status.

## 2021-06-09 ENCOUNTER — Ambulatory Visit: Payer: BC Managed Care – PPO | Admitting: Anesthesiology

## 2021-06-09 ENCOUNTER — Encounter
Admission: RE | Disposition: A | Discharge status: Home / Self Care | Payer: Self-pay | Source: Home / Self Care | Attending: Surgery

## 2021-06-09 ENCOUNTER — Observation Stay
Admission: RE | Admit: 2021-06-09 | Discharge: 2021-06-10 | Disposition: A | Discharge status: Home / Self Care | Payer: BC Managed Care – PPO | Attending: Surgery | Admitting: Surgery

## 2021-06-09 ENCOUNTER — Encounter: Payer: Self-pay | Admitting: Surgery

## 2021-06-09 DIAGNOSIS — Z9889 Other specified postprocedural states: Secondary | ICD-10-CM

## 2021-06-09 DIAGNOSIS — K432 Incisional hernia without obstruction or gangrene: Secondary | ICD-10-CM

## 2021-06-09 DIAGNOSIS — K439 Ventral hernia without obstruction or gangrene: Secondary | ICD-10-CM | POA: Diagnosis not present

## 2021-06-09 DIAGNOSIS — K219 Gastro-esophageal reflux disease without esophagitis: Secondary | ICD-10-CM | POA: Diagnosis not present

## 2021-06-09 DIAGNOSIS — K449 Diaphragmatic hernia without obstruction or gangrene: Secondary | ICD-10-CM | POA: Diagnosis not present

## 2021-06-09 DIAGNOSIS — Z8719 Personal history of other diseases of the digestive system: Secondary | ICD-10-CM

## 2021-06-09 HISTORY — PX: XI ROBOTIC ASSISTED HIATAL HERNIA REPAIR: SHX6889

## 2021-06-09 HISTORY — PX: INSERTION OF MESH: SHX5868

## 2021-06-09 HISTORY — PX: VENTRAL HERNIA REPAIR: SHX424

## 2021-06-09 LAB — CBC
HCT: 43.5 % (ref 39.0–52.0)
Hemoglobin: 15.1 g/dL (ref 13.0–17.0)
MCH: 32.8 pg (ref 26.0–34.0)
MCHC: 34.7 g/dL (ref 30.0–36.0)
MCV: 94.4 fL (ref 80.0–100.0)
Platelets: 277 10*3/uL (ref 150–400)
RBC: 4.61 MIL/uL (ref 4.22–5.81)
RDW: 12.8 % (ref 11.5–15.5)
WBC: 12.1 10*3/uL — ABNORMAL HIGH (ref 4.0–10.5)
nRBC: 0 % (ref 0.0–0.2)

## 2021-06-09 LAB — CREATININE, SERUM
Creatinine, Ser: 1.18 mg/dL (ref 0.61–1.24)
GFR, Estimated: >60 mL/min (ref >60)

## 2021-06-09 LAB — HIV ANTIBODY (ROUTINE TESTING W REFLEX): HIV Screen 4th Generation wRfx: NONREACTIVE

## 2021-06-09 LAB — SARS CORONAVIRUS 2 (TAT 6-24 HRS): SARS Coronavirus 2: NEGATIVE

## 2021-06-09 SURGERY — REPAIR, HERNIA, HIATAL, ROBOT-ASSISTED
Anesthesia: General

## 2021-06-09 MED ORDER — SODIUM CHLORIDE 0.9 % IV SOLN: INTRAVENOUS | Status: DC

## 2021-06-09 MED ORDER — CHLORHEXIDINE GLUCONATE 0.12 % MT SOLN: 15.0000 mL | Freq: Once | OROMUCOSAL | Status: AC

## 2021-06-09 MED ORDER — KETOROLAC TROMETHAMINE 30 MG/ML IJ SOLN
INTRAMUSCULAR | Status: AC
  Filled 2021-06-09: qty 1

## 2021-06-09 MED ORDER — PROCHLORPERAZINE MALEATE 10 MG PO TABS
10.0000 mg | ORAL_TABLET | Freq: Four times a day (QID) | ORAL | Status: DC | PRN
  Filled 2021-06-09: qty 1

## 2021-06-09 MED ORDER — BUPROPION HCL ER (XL) 150 MG PO TB24
150.0000 mg | ORAL_TABLET | Freq: Every day | ORAL | Status: DC
  Administered 2021-06-10: 08:00:00 150 mg via ORAL
  Filled 2021-06-09: qty 1

## 2021-06-09 MED ORDER — ACETAMINOPHEN 500 MG PO TABS: 1000.0000 mg | ORAL_TABLET | ORAL | Status: AC

## 2021-06-09 MED ORDER — CEFAZOLIN SODIUM-DEXTROSE 2-4 GM/100ML-% IV SOLN
INTRAVENOUS | Status: AC
  Filled 2021-06-09: qty 100

## 2021-06-09 MED ORDER — FENTANYL CITRATE (PF) 100 MCG/2ML IJ SOLN
25.0000 ug | INTRAMUSCULAR | Status: DC | PRN
  Administered 2021-06-09 (×2): 25 ug via INTRAVENOUS

## 2021-06-09 MED ORDER — CHLORHEXIDINE GLUCONATE 0.12 % MT SOLN
OROMUCOSAL | Status: AC
  Administered 2021-06-09: 15 mL via OROMUCOSAL
  Filled 2021-06-09: qty 15

## 2021-06-09 MED ORDER — OXYCODONE HCL 5 MG PO TABS: 5.0000 mg | ORAL_TABLET | Freq: Once | ORAL | Status: DC | PRN

## 2021-06-09 MED ORDER — VISTASEAL 10 ML SINGLE DOSE KIT
PACK | CUTANEOUS | Status: AC
  Filled 2021-06-09: qty 10

## 2021-06-09 MED ORDER — ROCURONIUM BROMIDE 10 MG/ML (PF) SYRINGE
PREFILLED_SYRINGE | INTRAVENOUS | Status: AC
  Filled 2021-06-09: qty 20

## 2021-06-09 MED ORDER — PREGABALIN 75 MG PO CAPS
ORAL_CAPSULE | ORAL | Status: AC
  Filled 2021-06-09: qty 1

## 2021-06-09 MED ORDER — PHENYLEPHRINE 40 MCG/ML (10ML) SYRINGE FOR IV PUSH (FOR BLOOD PRESSURE SUPPORT)
PREFILLED_SYRINGE | INTRAVENOUS | Status: DC | PRN
Start: 2021-06-09 — End: 2021-06-09
  Administered 2021-06-09 (×3): 80 ug via INTRAVENOUS

## 2021-06-09 MED ORDER — DIPHENHYDRAMINE HCL 12.5 MG/5ML PO ELIX
12.5000 mg | ORAL_SOLUTION | Freq: Four times a day (QID) | ORAL | Status: DC | PRN
  Filled 2021-06-09: qty 5

## 2021-06-09 MED ORDER — CELECOXIB 200 MG PO CAPS: 200.0000 mg | ORAL_CAPSULE | ORAL | Status: AC

## 2021-06-09 MED ORDER — SEVOFLURANE IN SOLN
RESPIRATORY_TRACT | Status: AC
  Filled 2021-06-09: qty 250

## 2021-06-09 MED ORDER — CEFAZOLIN SODIUM-DEXTROSE 2-4 GM/100ML-% IV SOLN: 2.0000 g | Freq: Three times a day (TID) | INTRAVENOUS | Status: AC

## 2021-06-09 MED ORDER — FENTANYL CITRATE (PF) 100 MCG/2ML IJ SOLN
INTRAMUSCULAR | Status: AC
  Filled 2021-06-09: qty 2

## 2021-06-09 MED ORDER — SUGAMMADEX SODIUM 200 MG/2ML IV SOLN
INTRAVENOUS | Status: DC | PRN
  Administered 2021-06-09: 200 mg via INTRAVENOUS

## 2021-06-09 MED ORDER — DIPHENHYDRAMINE HCL 50 MG/ML IJ SOLN: 12.5000 mg | Freq: Four times a day (QID) | INTRAMUSCULAR | Status: DC | PRN

## 2021-06-09 MED ORDER — ENOXAPARIN SODIUM 40 MG/0.4ML IJ SOSY: 40.0000 mg | PREFILLED_SYRINGE | Freq: Every day | INTRAMUSCULAR | Status: DC

## 2021-06-09 MED ORDER — BUPIVACAINE-EPINEPHRINE (PF) 0.25% -1:200000 IJ SOLN
INTRAMUSCULAR | Status: AC
  Filled 2021-06-09: qty 30

## 2021-06-09 MED ORDER — OXYCODONE HCL 5 MG PO TABS
ORAL_TABLET | ORAL | Status: AC
  Administered 2021-06-09: 5 mg via ORAL
  Filled 2021-06-09: qty 1

## 2021-06-09 MED ORDER — ONDANSETRON HCL 4 MG/2ML IJ SOLN: 4.0000 mg | Freq: Four times a day (QID) | INTRAMUSCULAR | Status: DC | PRN

## 2021-06-09 MED ORDER — ONDANSETRON HCL 4 MG/2ML IJ SOLN
INTRAMUSCULAR | Status: DC | PRN
  Administered 2021-06-09: 4 mg via INTRAVENOUS

## 2021-06-09 MED ORDER — HYDROMORPHONE HCL 1 MG/ML IJ SOLN
INTRAMUSCULAR | Status: DC | PRN
  Administered 2021-06-09: .5 mg via INTRAVENOUS

## 2021-06-09 MED ORDER — DEXMEDETOMIDINE (PRECEDEX) IN NS 20 MCG/5ML (4 MCG/ML) IV SYRINGE
PREFILLED_SYRINGE | INTRAVENOUS | Status: DC | PRN
  Administered 2021-06-09: 4 ug via INTRAVENOUS
  Administered 2021-06-09: 8 ug via INTRAVENOUS
  Administered 2021-06-09 (×2): 4 ug via INTRAVENOUS

## 2021-06-09 MED ORDER — SODIUM CHLORIDE 0.9 % IR SOLN
Status: DC | PRN
  Administered 2021-06-09: 100 mL

## 2021-06-09 MED ORDER — CHLORHEXIDINE GLUCONATE CLOTH 2 % EX PADS: 6.0000 | MEDICATED_PAD | Freq: Once | CUTANEOUS | Status: DC

## 2021-06-09 MED ORDER — ONDANSETRON HCL 4 MG/2ML IJ SOLN
INTRAMUSCULAR | Status: AC
  Filled 2021-06-09: qty 4

## 2021-06-09 MED ORDER — KETOROLAC TROMETHAMINE 30 MG/ML IJ SOLN
30.0000 mg | Freq: Four times a day (QID) | INTRAMUSCULAR | Status: DC
  Administered 2021-06-09 – 2021-06-10 (×2): 30 mg via INTRAVENOUS

## 2021-06-09 MED ORDER — GABAPENTIN 300 MG PO CAPS
ORAL_CAPSULE | ORAL | Status: AC
  Administered 2021-06-09: 300 mg via ORAL
  Filled 2021-06-09: qty 1

## 2021-06-09 MED ORDER — PROCHLORPERAZINE EDISYLATE 10 MG/2ML IJ SOLN
5.0000 mg | Freq: Four times a day (QID) | INTRAMUSCULAR | Status: DC | PRN
  Filled 2021-06-09: qty 2

## 2021-06-09 MED ORDER — MIDAZOLAM HCL 2 MG/2ML IJ SOLN
INTRAMUSCULAR | Status: AC
  Filled 2021-06-09: qty 2

## 2021-06-09 MED ORDER — DEXAMETHASONE SODIUM PHOSPHATE 10 MG/ML IJ SOLN
INTRAMUSCULAR | Status: AC
  Filled 2021-06-09: qty 1

## 2021-06-09 MED ORDER — FENTANYL CITRATE (PF) 100 MCG/2ML IJ SOLN
INTRAMUSCULAR | Status: AC
  Administered 2021-06-09: 25 ug via INTRAVENOUS
  Filled 2021-06-09: qty 2

## 2021-06-09 MED ORDER — BUPIVACAINE LIPOSOME 1.3 % IJ SUSP
INTRAMUSCULAR | Status: DC | PRN
  Administered 2021-06-09: 20 mL

## 2021-06-09 MED ORDER — HEPARIN SODIUM (PORCINE) 5000 UNIT/ML IJ SOLN
INTRAMUSCULAR | Status: AC
  Administered 2021-06-09: 5000 [IU] via SUBCUTANEOUS
  Filled 2021-06-09: qty 1

## 2021-06-09 MED ORDER — ROCURONIUM BROMIDE 100 MG/10ML IV SOLN
INTRAVENOUS | Status: DC | PRN
  Administered 2021-06-09: 20 mg via INTRAVENOUS
  Administered 2021-06-09: 50 mg via INTRAVENOUS
  Administered 2021-06-09: 30 mg via INTRAVENOUS
  Administered 2021-06-09 (×3): 20 mg via INTRAVENOUS

## 2021-06-09 MED ORDER — FENTANYL CITRATE (PF) 100 MCG/2ML IJ SOLN
INTRAMUSCULAR | Status: DC | PRN
  Administered 2021-06-09 (×2): 50 ug via INTRAVENOUS
  Administered 2021-06-09: 100 ug via INTRAVENOUS

## 2021-06-09 MED ORDER — ONDANSETRON 4 MG PO TBDP: 4.0000 mg | ORAL_TABLET | Freq: Four times a day (QID) | ORAL | Status: DC | PRN

## 2021-06-09 MED ORDER — VISTASEAL 10 ML SINGLE DOSE KIT
PACK | CUTANEOUS | Status: DC | PRN
  Administered 2021-06-09: 10 mL via TOPICAL

## 2021-06-09 MED ORDER — PROPOFOL 10 MG/ML IV BOLUS
INTRAVENOUS | Status: DC | PRN
  Administered 2021-06-09: 200 mg via INTRAVENOUS

## 2021-06-09 MED ORDER — FAMOTIDINE 20 MG PO TABS: 20.0000 mg | ORAL_TABLET | Freq: Once | ORAL | Status: AC

## 2021-06-09 MED ORDER — KETOROLAC TROMETHAMINE 15 MG/ML IJ SOLN
INTRAMUSCULAR | Status: AC
  Filled 2021-06-09: qty 1

## 2021-06-09 MED ORDER — MIDAZOLAM HCL 2 MG/2ML IJ SOLN
INTRAMUSCULAR | Status: DC | PRN
  Administered 2021-06-09: 2 mg via INTRAVENOUS

## 2021-06-09 MED ORDER — GLYCOPYRROLATE 0.2 MG/ML IJ SOLN
INTRAMUSCULAR | Status: AC
  Filled 2021-06-09: qty 1

## 2021-06-09 MED ORDER — FAMOTIDINE 20 MG PO TABS
ORAL_TABLET | ORAL | Status: AC
  Administered 2021-06-09: 20 mg via ORAL
  Filled 2021-06-09: qty 1

## 2021-06-09 MED ORDER — HEPARIN SODIUM (PORCINE) 5000 UNIT/ML IJ SOLN: 5000.0000 [IU] | Freq: Once | INTRAMUSCULAR | Status: AC

## 2021-06-09 MED ORDER — MELATONIN 5 MG PO TABS
2.5000 mg | ORAL_TABLET | Freq: Every evening | ORAL | Status: DC | PRN
  Filled 2021-06-09: qty 0.5

## 2021-06-09 MED ORDER — OXYCODONE HCL 5 MG PO TABS
5.0000 mg | ORAL_TABLET | ORAL | Status: DC | PRN
  Administered 2021-06-10: 01:00:00 10 mg via ORAL
  Administered 2021-06-10: 11:00:00 5 mg via ORAL

## 2021-06-09 MED ORDER — ACETAMINOPHEN 500 MG PO TABS
ORAL_TABLET | ORAL | Status: AC
  Administered 2021-06-09: 1000 mg via ORAL
  Filled 2021-06-09: qty 2

## 2021-06-09 MED ORDER — BUPIVACAINE-EPINEPHRINE 0.25% -1:200000 IJ SOLN
INTRAMUSCULAR | Status: DC | PRN
  Administered 2021-06-09: 30 mL

## 2021-06-09 MED ORDER — LIDOCAINE HCL (CARDIAC) PF 100 MG/5ML IV SOSY
PREFILLED_SYRINGE | INTRAVENOUS | Status: DC | PRN
  Administered 2021-06-09: 100 mg via INTRAVENOUS

## 2021-06-09 MED ORDER — PREGABALIN 75 MG PO CAPS
75.0000 mg | ORAL_CAPSULE | Freq: Three times a day (TID) | ORAL | Status: DC
  Administered 2021-06-09 (×2): 75 mg via ORAL

## 2021-06-09 MED ORDER — ORAL CARE MOUTH RINSE: 15.0000 mL | Freq: Once | OROMUCOSAL | Status: AC

## 2021-06-09 MED ORDER — CHLORHEXIDINE GLUCONATE CLOTH 2 % EX PADS
6.0000 | MEDICATED_PAD | Freq: Once | CUTANEOUS | Status: AC
  Administered 2021-06-09: 6 via TOPICAL

## 2021-06-09 MED ORDER — CEFAZOLIN SODIUM-DEXTROSE 2-4 GM/100ML-% IV SOLN
INTRAVENOUS | Status: AC
  Administered 2021-06-09: 2 g via INTRAVENOUS
  Filled 2021-06-09: qty 100

## 2021-06-09 MED ORDER — ACETAMINOPHEN 500 MG PO TABS
1000.0000 mg | ORAL_TABLET | Freq: Four times a day (QID) | ORAL | Status: DC
  Administered 2021-06-10: 05:00:00 1000 mg via ORAL

## 2021-06-09 MED ORDER — HYDROMORPHONE HCL 1 MG/ML IJ SOLN
INTRAMUSCULAR | Status: AC
  Filled 2021-06-09: qty 1

## 2021-06-09 MED ORDER — BUSPIRONE HCL 10 MG PO TABS
10.0000 mg | ORAL_TABLET | Freq: Every day | ORAL | Status: DC
  Administered 2021-06-10: 08:00:00 10 mg via ORAL
  Filled 2021-06-09: qty 1

## 2021-06-09 MED ORDER — CELECOXIB 200 MG PO CAPS
ORAL_CAPSULE | ORAL | Status: AC
  Administered 2021-06-09: 200 mg via ORAL
  Filled 2021-06-09: qty 1

## 2021-06-09 MED ORDER — GABAPENTIN 300 MG PO CAPS: 300.0000 mg | ORAL_CAPSULE | ORAL | Status: AC

## 2021-06-09 MED ORDER — OXYCODONE HCL 5 MG/5ML PO SOLN: 5.0000 mg | Freq: Once | ORAL | Status: DC | PRN

## 2021-06-09 MED ORDER — DEXAMETHASONE SODIUM PHOSPHATE 10 MG/ML IJ SOLN
INTRAMUSCULAR | Status: AC
  Filled 2021-06-09: qty 2

## 2021-06-09 MED ORDER — CEFAZOLIN SODIUM-DEXTROSE 2-4 GM/100ML-% IV SOLN
2.0000 g | INTRAVENOUS | Status: AC
  Administered 2021-06-09: 2 g via INTRAVENOUS

## 2021-06-09 MED ORDER — PHENYLEPHRINE HCL-NACL 20-0.9 MG/250ML-% IV SOLN
INTRAVENOUS | Status: AC
  Filled 2021-06-09: qty 250

## 2021-06-09 MED ORDER — ESMOLOL HCL 100 MG/10ML IV SOLN
INTRAVENOUS | Status: DC | PRN
  Administered 2021-06-09 (×2): 10 ug via INTRAVENOUS

## 2021-06-09 MED ORDER — LACTATED RINGERS IV SOLN: INTRAVENOUS | Status: DC

## 2021-06-09 MED ORDER — HYDRALAZINE HCL 20 MG/ML IJ SOLN
10.0000 mg | INTRAMUSCULAR | Status: DC | PRN
  Filled 2021-06-09: qty 0.5

## 2021-06-09 MED ORDER — BUPIVACAINE LIPOSOME 1.3 % IJ SUSP
INTRAMUSCULAR | Status: AC
  Filled 2021-06-09: qty 20

## 2021-06-09 MED ORDER — PROPOFOL 10 MG/ML IV BOLUS
INTRAVENOUS | Status: AC
  Filled 2021-06-09: qty 20

## 2021-06-09 MED ORDER — DEXAMETHASONE SODIUM PHOSPHATE 10 MG/ML IJ SOLN
INTRAMUSCULAR | Status: DC | PRN
  Administered 2021-06-09 (×2): 10 mg via INTRAVENOUS

## 2021-06-09 MED ORDER — VALACYCLOVIR HCL 500 MG PO TABS
1000.0000 mg | ORAL_TABLET | Freq: Every day | ORAL | Status: DC
  Administered 2021-06-09: 1000 mg via ORAL
  Filled 2021-06-09 (×2): qty 2

## 2021-06-09 SURGICAL SUPPLY — 85 items
"PENCIL ELECTRO HAND CTR " (MISCELLANEOUS) ×2 IMPLANT
APPLICATOR VISTASEAL 35 (MISCELLANEOUS) ×2 IMPLANT
APPLIER CLIP 11 MED OPEN (CLIP)
APPLIER CLIP 13 LRG OPEN (CLIP)
BLADE CLIPPER SURG (BLADE) ×4 IMPLANT
BLADE SURG 15 STRL LF DISP TIS (BLADE) ×2 IMPLANT
BLADE SURG 15 STRL SS (BLADE) ×2
CANNULA REDUC XI 12-8 STAPL (CANNULA) ×1
CANNULA REDUC XI 12-8MM STAPL (CANNULA) ×1
CANNULA REDUCER 12-8 DVNC XI (CANNULA) ×2 IMPLANT
CHLORAPREP W/TINT 26 (MISCELLANEOUS) ×2 IMPLANT
CLIP APPLIE 11 MED OPEN (CLIP) ×2 IMPLANT
CLIP APPLIE 13 LRG OPEN (CLIP) ×2 IMPLANT
DECANTER SPIKE VIAL GLASS SM (MISCELLANEOUS) ×4 IMPLANT
DERMABOND ADVANCED (GAUZE/BANDAGES/DRESSINGS) ×2
DERMABOND ADVANCED .7 DNX12 (GAUZE/BANDAGES/DRESSINGS) ×2 IMPLANT
DRAPE 3/4 80X56 (DRAPES) ×2 IMPLANT
DRAPE ARM DVNC X/XI (DISPOSABLE) ×8 IMPLANT
DRAPE COLUMN DVNC XI (DISPOSABLE) ×2 IMPLANT
DRAPE DA VINCI XI ARM (DISPOSABLE) ×8
DRAPE DA VINCI XI COLUMN (DISPOSABLE) ×2
DRAPE LAPAROTOMY 100X77 ABD (DRAPES) ×4 IMPLANT
DRSG TELFA 3X8 NADH (GAUZE/BANDAGES/DRESSINGS) IMPLANT
ELECT CAUTERY BLADE 6.4 (BLADE) ×4 IMPLANT
ELECT REM PT RETURN 9FT ADLT (ELECTROSURGICAL) ×4
ELECTRODE REM PT RTRN 9FT ADLT (ELECTROSURGICAL) ×2 IMPLANT
GAUZE 4X4 16PLY ~~LOC~~+RFID DBL (SPONGE) ×2 IMPLANT
GAUZE SPONGE 4X4 12PLY STRL (GAUZE/BANDAGES/DRESSINGS) ×2 IMPLANT
GLOVE SURG ENC MOIS LTX SZ7 (GLOVE) ×18 IMPLANT
GOWN STRL REUS W/ TWL LRG LVL3 (GOWN DISPOSABLE) ×8 IMPLANT
GOWN STRL REUS W/TWL LRG LVL3 (GOWN DISPOSABLE) ×10
GRASPER LAPSCPC 5X45 DSP (INSTRUMENTS) ×4 IMPLANT
IRRIGATION STRYKERFLOW (MISCELLANEOUS) IMPLANT
IRRIGATOR STRYKERFLOW (MISCELLANEOUS) ×4
IV NS 1000ML (IV SOLUTION) ×2
IV NS 1000ML BAXH (IV SOLUTION) IMPLANT
KIT PINK PAD W/HEAD ARE REST (MISCELLANEOUS) ×4
KIT PINK PAD W/HEAD ARM REST (MISCELLANEOUS) ×2 IMPLANT
KIT TURNOVER CYSTO (KITS) ×4 IMPLANT
LABEL OR SOLS (LABEL) ×4 IMPLANT
MANIFOLD NEPTUNE II (INSTRUMENTS) ×4 IMPLANT
MESH BIO-A 7X10 SYN MAT (Mesh General) ×2 IMPLANT
MESH VENTRALEX ST 1-7/10 CRC S (Mesh General) ×2 IMPLANT
MESH VENTRALEX ST 2.5 CRC MED (Mesh General) ×2 IMPLANT
NDL HYPO 25X1 1.5 SAFETY (NEEDLE) ×2 IMPLANT
NEEDLE HYPO 22GX1.5 SAFETY (NEEDLE) ×4 IMPLANT
NEEDLE HYPO 25X1 1.5 SAFETY (NEEDLE) ×4 IMPLANT
OBTURATOR OPTICAL STANDARD 8MM (TROCAR) ×2
OBTURATOR OPTICAL STND 8 DVNC (TROCAR) ×2
OBTURATOR OPTICALSTD 8 DVNC (TROCAR) ×2 IMPLANT
PACK BASIN MINOR ARMC (MISCELLANEOUS) ×2 IMPLANT
PACK LAP CHOLECYSTECTOMY (MISCELLANEOUS) ×4 IMPLANT
PAD DRESSING TELFA 3X8 NADH (GAUZE/BANDAGES/DRESSINGS) ×2 IMPLANT
PENCIL ELECTRO HAND CTR (MISCELLANEOUS) ×4 IMPLANT
POUCH SPECIMEN RETRIEVAL 10MM (ENDOMECHANICALS) ×2 IMPLANT
SEAL CANN UNIV 5-8 DVNC XI (MISCELLANEOUS) ×6 IMPLANT
SEAL XI 5MM-8MM UNIVERSAL (MISCELLANEOUS) ×6
SEALER VESSEL DA VINCI XI (MISCELLANEOUS) ×2
SEALER VESSEL EXT DVNC XI (MISCELLANEOUS) ×2 IMPLANT
SOLUTION ELECTROLUBE (MISCELLANEOUS) ×4 IMPLANT
SPONGE T-LAP 18X18 ~~LOC~~+RFID (SPONGE) ×6 IMPLANT
STAPLER CANNULA SEAL DVNC XI (STAPLE) ×2 IMPLANT
STAPLER CANNULA SEAL XI (STAPLE) ×2
STAPLER SKIN PROX 35W (STAPLE) ×2 IMPLANT
SUT ETHIBOND 0 MO6 C/R (SUTURE) ×6 IMPLANT
SUT MNCRL 4-0 (SUTURE) ×4
SUT MNCRL 4-0 27XMFL (SUTURE) ×4
SUT SILK 2 0 SH (SUTURE) ×10 IMPLANT
SUT VIC AB 2-0 SH 27 (SUTURE) ×2
SUT VIC AB 2-0 SH 27XBRD (SUTURE) ×4 IMPLANT
SUT VIC AB 3-0 SH 27 (SUTURE) ×6
SUT VIC AB 3-0 SH 27X BRD (SUTURE) IMPLANT
SUT VICRYL 0 AB UR-6 (SUTURE) ×8 IMPLANT
SUT VLOC 90 S/L VL9 GS22 (SUTURE) ×6 IMPLANT
SUTURE MNCRL 4-0 27XMF (SUTURE) ×2 IMPLANT
SYR 20ML LL LF (SYRINGE) ×4 IMPLANT
SYR 30ML LL (SYRINGE) ×4 IMPLANT
TAPE MICROFOAM 4IN (TAPE) ×2 IMPLANT
TAPE TRANSPORE STRL 2 31045 (GAUZE/BANDAGES/DRESSINGS) ×2 IMPLANT
TRAY FOLEY SLVR 16FR LF STAT (SET/KITS/TRAYS/PACK) ×4 IMPLANT
TROCAR BALLN GELPORT 12X130M (ENDOMECHANICALS) ×4 IMPLANT
TROCAR XCEL NON-BLD 5MMX100MML (ENDOMECHANICALS) ×4 IMPLANT
TUBING EVAC SMOKE HEATED PNEUM (TUBING) ×4 IMPLANT
WATER STERILE IRR 1000ML POUR (IV SOLUTION) ×2 IMPLANT
WATER STERILE IRR 500ML POUR (IV SOLUTION) ×4 IMPLANT

## 2021-06-09 NOTE — Transfer of Care (Signed)
Immediate Anesthesia Transfer of Care Note  Patient: Nathan Moody  Procedure(s) Performed: XI ROBOTIC ASSISTED HIATAL HERNIA REPAIR, RNFA to assist HERNIA REPAIR VENTRAL ADULT, incisional INSERTION OF MESH  Patient Location: PACU  Anesthesia Type:General  Level of Consciousness: sedated  Airway & Oxygen Therapy: Patient Spontanous Breathing and Patient connected to face mask oxygen  Post-op Assessment: Report given to RN and Post -op Vital signs reviewed and stable  Post vital signs: Reviewed and stable  Last Vitals:  Vitals Value Taken Time  BP 125/92 06/09/21 1152  Temp    Pulse 98 06/09/21 1152  Resp 20 06/09/21 1152  SpO2 98 % 06/09/21 1152  Vitals shown include unvalidated device data.  Last Pain:  Vitals:   06/09/21 0622  TempSrc: Oral  PainSc: 4          Complications: No notable events documented.

## 2021-06-09 NOTE — Anesthesia Procedure Notes (Signed)
Procedure Name: Intubation Date/Time: 06/09/2021 7:36 AM Performed by: Lily Peer, Jasiel Belisle, CRNA Pre-anesthesia Checklist: Patient identified, Emergency Drugs available, Suction available and Patient being monitored Patient Re-evaluated:Patient Re-evaluated prior to induction Oxygen Delivery Method: Circle system utilized Preoxygenation: Pre-oxygenation with 100% oxygen Induction Type: IV induction Ventilation: Mask ventilation without difficulty Laryngoscope Size: McGraph and 4 Grade View: Grade I Tube type: Oral Tube size: 7.5 mm Number of attempts: 1 Airway Equipment and Method: Stylet Placement Confirmation: ETT inserted through vocal cords under direct vision, positive ETCO2 and breath sounds checked- equal and bilateral Secured at: 23 cm Tube secured with: Tape Dental Injury: Teeth and Oropharynx as per pre-operative assessment

## 2021-06-09 NOTE — Anesthesia Preprocedure Evaluation (Signed)
Anesthesia Evaluation  Patient identified by MRN, date of birth, ID band Patient awake    Reviewed: Allergy & Precautions, NPO status , Patient's Chart, lab work & pertinent test results  History of Anesthesia Complications Negative for: history of anesthetic complications  Airway Mallampati: II  TM Distance: >3 FB Neck ROM: full    Dental  (+) Chipped   Pulmonary neg pulmonary ROS, neg shortness of breath,    Pulmonary exam normal        Cardiovascular hypertension, (-) angina(-) Past MI Normal cardiovascular exam     Neuro/Psych PSYCHIATRIC DISORDERS negative neurological ROS     GI/Hepatic Neg liver ROS, hiatal hernia, GERD  Medicated and Controlled,  Endo/Other  negative endocrine ROS  Renal/GU      Musculoskeletal   Abdominal   Peds  Hematology negative hematology ROS (+)   Anesthesia Other Findings Past Medical History: No date: Anxiety No date: Depression No date: Hypertension No date: Paraesophageal hernia No date: Umbilical hernia  Past Surgical History: 52/77/8242: UMBILICAL HERNIA REPAIR     Comment:  and epigastric hernia repair  BMI    Body Mass Index: 30.34 kg/m      Reproductive/Obstetrics negative OB ROS                             Anesthesia Physical Anesthesia Plan  ASA: 3  Anesthesia Plan: General ETT   Post-op Pain Management:    Induction: Intravenous  PONV Risk Score and Plan: Ondansetron, Dexamethasone, Midazolam and Treatment may vary due to age or medical condition  Airway Management Planned: Oral ETT  Additional Equipment:   Intra-op Plan:   Post-operative Plan: Extubation in OR  Informed Consent: I have reviewed the patients History and Physical, chart, labs and discussed the procedure including the risks, benefits and alternatives for the proposed anesthesia with the patient or authorized representative who has indicated his/her  understanding and acceptance.     Dental Advisory Given  Plan Discussed with: Anesthesiologist, CRNA and Surgeon  Anesthesia Plan Comments: (Patient consented for risks of anesthesia including but not limited to:  - adverse reactions to medications - damage to eyes, teeth, lips or other oral mucosa - nerve damage due to positioning  - sore throat or hoarseness - Damage to heart, brain, nerves, lungs, other parts of body or loss of life  Patient voiced understanding.)        Anesthesia Quick Evaluation

## 2021-06-09 NOTE — Op Note (Signed)
Robotic assisted laparoscopic Nissen fundoplication w repair of Type III paraesophageal hernia with Bio-A Mesh 10x7cm Recurrent INcisional hernia repair Infraumbilical using ventralex Patch 4.3 cm Repair of Epigastric Hernia using Ventralex Patch via same robotic incision   Pre-operative Diagnosis: GERD, paraesophageal hernia hernia. Epigastric hernia, recurrent infraumbilical hernia  Post-operative Diagnosis: same  Procedure:   Surgeon: Caroleen Hamman, MD FACS  Assistant: . Required due to the complexity of the case the need for exposure and lack of first assist.  Anesthesia: Gen. with endotracheal tube  Findings: Type III paraesophageal hernia Loose wrap 360 degree over 50 FR Bougie 3 cm epigastric ventral hernia 1.5 cm incisional recurrent Infraumbilical hernia  Estimated Blood Loss: 33IR       Complications: none   Procedure Details  The patient was seen again in the Holding Room. The benefits, complications, treatment options, and expected outcomes were discussed with the patient. The risks of bleeding, infection, recurrence of symptoms, failure to resolve symptoms,  esophageal damage, Dysphagia, bowel injury, any of which could require further surgery were reviewed with the patient. The likelihood of improving the patient's symptoms with return to their baseline status is good.  The patient and/or family concurred with the proposed plan, giving informed consent.  The patient was taken to Operating Room, identified  and the procedure verified.  A Time Out was held and the above information confirmed.  Prior to the induction of general anesthesia, antibiotic prophylaxis was administered. VTE prophylaxis was in place. General endotracheal anesthesia was then administered and tolerated well. After the induction, the abdomen was prepped with Chloraprep and draped in the sterile fashion. The patient was positioned in the supine position.  Cut down technique was used to enter the  abdominal cavity over the epigastric hernia, sac was dissected and excised. A Hasson trochar was placed after two vicryl stitches were anchored to the fascia. Pneumoperitoneum was then created with CO2 and tolerated well without any adverse changes in the patient's vital signs.  Three 8-mm ports were placed under direct vision. All skin incisions  were infiltrated with a local anesthetic agent before making the incision and placing the trocars. An additional 5 mm regular laparoscopic port was placed to assist with retraction and exposure.   The patient was positioned  in reverse Trendelenburg, robot was brought to the surgical field and docked in the standard fashion.  We made sure all the instrumentation was kept indirect view at all times and that there were no collision between the arms. I scrubbed out and went to the console.  I used a robotic arm to retract the liver, the vessel sealer on my right hand and a forced bipolar grasper on my left hand.  There is along the extra 5 mm port allow me ample exposure and the ability to perform meticulous dissection  We Started dividing the lesser omentum via the pars flaccida.  We Were able to dissect the lesser curvature of the stomach and  dissected the fundus free from the right and left crus.  We circumferentially dissected the GE junction.  The hernia sac was also completely reduced and we were able to bring the stomach into the intra-abdominal position.  Attention then was turned to the greater curvature where the short gastrics were divided with sealer device.  We were able to identify the left crus and again were able to make sure there was a good circumferential dissection and that the hernia sac was completely excised.  We did perform a good mediastinal dissection  to allow a complete reduction of the sac and a to completely allow an intra-abdominal Nissen fundoplication.Sac was excised. Bio-A mesh placed and secure using vistalseal.  2-0V lock suture  was inserted and the crus as well as the hernia was closed with a running suture we using a pledget technique w two strips of BIo-A mesh.  We Asked anesthesia to place a 50 French bougie and this went with some difficulty due to some issues within the hypopharynx, Dr. Andree Elk helped me placed the Bougie and we did not have any issues after ET tube was moved by anesthesia.  We also observe trajectory of the bougie. 360 degree Nissen fundoplication was created with multiple 2-0 silk sutures and we placed 3 stitches taking some of the esophagus within that bite.  The fundoplication measured approximately 3-1/2 cm and he was floppy. I was very happy with the way the fundoplication laid and the repair of the hernia.  Inspection of the  upper quadrant was performed. No bleeding, bile  Or esophageal injuries leaks, or bowel injuries were noted. Hernia sac from paraesophageal hernia was excised. Robotic instruments and robotic arms were undocked in the standard fashion. All the needles were removed under direct visualization.   I scrubbed back in.  Pneumoperitoneum was released.   I palpated the anterior abdominal wall and confirmed there was an incisional infraumbilical hernia defect that needed to be addressed. A separate infraumbilical incision was created and hernia sac was excised. We placed a 4.3 cm ventralex patch using 4 trans fascial Ethibond stitches in each corner and the defect was then closed with interrupted 0 ethibond sutures.   Attention was turned to the epigastric incision and we also used mesh to close the hernia defect. We placed a 6.4 cm ventralex patch using 4 trans fascial Ethibond stitches in each corner and the defect was then closed with interrupted 0 ethibond sutures.   4-0 subcuticular Monocryl was used to close the skin. Liposomal marcaine was injected to all the incisions sites.  Dermabond was  applied.  The patient was then extubated and brought to the recovery room in stable  condition. Sponge, lap, and needle counts were correct at closure and at the conclusion of the case.               Caroleen Hamman, MD, FACS

## 2021-06-09 NOTE — Interval H&P Note (Signed)
History and Physical Interval Note:  06/09/2021 7:20 AM  Nathan Moody  has presented today for surgery, with the diagnosis of hiatal hernia, ventral hernia.  The various methods of treatment have been discussed with the patient and family. After consideration of risks, benefits and other options for treatment, the patient has consented to  Procedure(s): XI ROBOTIC [place], RNFA to assist (N/A) HERNIA REPAIR VENTRAL ADULT, incisional (N/A) as a surgical intervention.  The patient's history has been reviewed, patient examined, no change in status, stable for surgery.  I have reviewed the patient's chart and labs.  Questions were answered to the patient's satisfaction.     [place]

## 2021-06-10 ENCOUNTER — Encounter: Payer: Self-pay | Admitting: Surgery

## 2021-06-10 DIAGNOSIS — K449 Diaphragmatic hernia without obstruction or gangrene: Secondary | ICD-10-CM | POA: Diagnosis not present

## 2021-06-10 LAB — BASIC METABOLIC PANEL
Anion gap: 4 — ABNORMAL LOW (ref 5–15)
BUN: 18 mg/dL (ref 6–20)
CO2: 24 mmol/L (ref 22–32)
Calcium: 8 mg/dL — ABNORMAL LOW (ref 8.9–10.3)
Chloride: 107 mmol/L (ref 98–111)
Creatinine, Ser: 0.98 mg/dL (ref 0.61–1.24)
GFR, Estimated: >60 mL/min (ref >60)
Glucose, Bld: 157 mg/dL — ABNORMAL HIGH (ref 70–99)
Potassium: 4.2 mmol/L (ref 3.5–5.1)
Sodium: 135 mmol/L (ref 135–145)

## 2021-06-10 LAB — CBC
HCT: 40.4 % (ref 39.0–52.0)
Hemoglobin: 14.2 g/dL (ref 13.0–17.0)
MCH: 33.4 pg (ref 26.0–34.0)
MCHC: 35.1 g/dL (ref 30.0–36.0)
MCV: 95.1 fL (ref 80.0–100.0)
Platelets: 253 10*3/uL (ref 150–400)
RBC: 4.25 MIL/uL (ref 4.22–5.81)
RDW: 12.9 % (ref 11.5–15.5)
WBC: 10.4 10*3/uL (ref 4.0–10.5)
nRBC: 0 % (ref 0.0–0.2)

## 2021-06-10 LAB — SURGICAL PATHOLOGY

## 2021-06-10 MED ORDER — CEFAZOLIN SODIUM-DEXTROSE 2-4 GM/100ML-% IV SOLN
INTRAVENOUS | Status: AC
  Administered 2021-06-10: 05:00:00 2 g via INTRAVENOUS
  Filled 2021-06-10: qty 100

## 2021-06-10 MED ORDER — IBUPROFEN 600 MG PO TABS: 600.0000 mg | ORAL_TABLET | Freq: Four times a day (QID) | ORAL | 0 refills | Status: DC | PRN

## 2021-06-10 MED ORDER — KETOROLAC TROMETHAMINE 30 MG/ML IJ SOLN
INTRAMUSCULAR | Status: AC
  Filled 2021-06-10: qty 1

## 2021-06-10 MED ORDER — OXYCODONE HCL 5 MG PO TABS
ORAL_TABLET | ORAL | Status: AC
  Filled 2021-06-10: qty 1

## 2021-06-10 MED ORDER — KETOROLAC TROMETHAMINE 30 MG/ML IJ SOLN
INTRAMUSCULAR | Status: AC
  Administered 2021-06-10: 01:00:00 30 mg via INTRAVENOUS
  Filled 2021-06-10: qty 1

## 2021-06-10 MED ORDER — PREGABALIN 75 MG PO CAPS: 75.0000 mg | ORAL_CAPSULE | Freq: Three times a day (TID) | ORAL | 0 refills | Status: DC

## 2021-06-10 MED ORDER — PREGABALIN 75 MG PO CAPS
ORAL_CAPSULE | ORAL | Status: AC
  Administered 2021-06-10: 10:00:00 75 mg via ORAL
  Filled 2021-06-10: qty 1

## 2021-06-10 MED ORDER — ACETAMINOPHEN 500 MG PO TABS
ORAL_TABLET | ORAL | Status: AC
  Filled 2021-06-10: qty 2

## 2021-06-10 MED ORDER — ACETAMINOPHEN 500 MG PO TABS
ORAL_TABLET | ORAL | Status: AC
  Administered 2021-06-10: 01:00:00 1000 mg via ORAL
  Filled 2021-06-10: qty 2

## 2021-06-10 MED ORDER — OXYCODONE HCL 5 MG PO TABS: 5.0000 mg | ORAL_TABLET | ORAL | 0 refills | Status: DC | PRN

## 2021-06-10 MED ORDER — OXYCODONE HCL 5 MG PO TABS
ORAL_TABLET | ORAL | Status: AC
  Filled 2021-06-10: qty 2

## 2021-06-10 MED ORDER — ENOXAPARIN SODIUM 40 MG/0.4ML IJ SOSY
PREFILLED_SYRINGE | INTRAMUSCULAR | Status: AC
  Administered 2021-06-10: 10:00:00 40 mg via SUBCUTANEOUS
  Filled 2021-06-10: qty 0.4

## 2021-06-10 NOTE — Progress Notes (Signed)
Follow-up appt. scheduled for Wednesday 07/01/21 at 8:45 AM with Dr. Dahlia Byes.

## 2021-06-10 NOTE — Anesthesia Postprocedure Evaluation (Signed)
Anesthesia Post Note  Patient: Nathan Moody  Procedure(s) Performed: XI ROBOTIC ASSISTED HIATAL HERNIA REPAIR, RNFA to assist HERNIA REPAIR VENTRAL ADULT, incisional INSERTION OF MESH  Patient location during evaluation: PACU Anesthesia Type: General Level of consciousness: awake and alert Pain management: pain level controlled Vital Signs Assessment: post-procedure vital signs reviewed and stable Respiratory status: spontaneous breathing, nonlabored ventilation, respiratory function stable and patient connected to nasal cannula oxygen Cardiovascular status: blood pressure returned to baseline and stable Postop Assessment: no apparent nausea or vomiting Anesthetic complications: no   No notable events documented.   Last Vitals:  Vitals:   06/10/21 0300 06/10/21 0525  BP: 117/79 124/86  Pulse: 99 96  Resp: 16 16  Temp: 36.8 C   SpO2: 95% 94%    Last Pain:  Vitals:   06/10/21 0300  TempSrc: Temporal  PainSc:                  Nathan Moody

## 2021-06-10 NOTE — Discharge Summary (Signed)
[facility] SURGICAL DISCHARGE SUMMARY  Patient ID: Man Effertz MRN: [id number] DOB/AGE: [birth year] 50 y.o.  Admit date: 06/09/2021 Discharge date: 06/10/2021  Discharge Diagnoses Patient Active Problem List   Diagnosis Date Noted   S/P repair of paraesophageal hernia 48/07/4095   Umbilical hernia without obstruction and without gangrene 01/08/2019    Consultants None  Procedures 06/09/2021:  Robotic assisted laparoscopic Nissen fundoplication w repair of Type III paraesophageal hernia with Bio-A Mesh 10x7cm Recurrent INcisional hernia repair Infraumbilical using ventralex Patch 4.3 cm Repair of Epigastric Hernia using Ventralex Patch via same robotic incision   HPI: Nathan Moody is a 50 y.o. male with a history of recalcitrant reflux secondary to hiatal hernia and also with recurrent incisional hernia who presents to [facility] on 12/13 for scheduled repair with Dr Dahlia Byes.   Hospital Course: Informed consent was obtained and documented, and patient underwent uneventful robotic assisted laparoscopic paraesophageal hernia repair, Nissen Fundoplication, and incisional and epigastric hernia repairs (Dr Dahlia Byes, 06/09/2021).  Post-operatively, patient did well. Advancement of patient's diet and ambulation were well-tolerated. The remainder of patient's hospital course was essentially unremarkable, and discharge planning was initiated accordingly with patient safely able to be discharged home with appropriate discharge instructions, pain control, and outpatient follow-up after all of his questions were answered to his expressed satisfaction.   Discharge Condition: Good   Physical Examination:  Constitutional: Well appearing male, NAD Pulmonary: Normal effort, no respiratory distress Gastrointestinal: Soft, incisional soreness, non-distended, no rebound/guarding Skin: Laparoscopic incisions are CDI with dermabond, some ecchymosis, no erythema    Allergies as of  06/10/2021   No Known Allergies      Medication List     TAKE these medications    amLODipine 5 MG tablet Commonly known as: NORVASC Take 5 mg by mouth daily.   buPROPion 150 MG 24 hr tablet Commonly known as: WELLBUTRIN XL Take 150 mg by mouth daily.   busPIRone 10 MG tablet Commonly known as: BUSPAR Take 10 mg by mouth daily in the afternoon.   ibuprofen 600 MG tablet Commonly known as: ADVIL Take 1 tablet (600 mg total) by mouth every 6 (six) hours as needed.   oxyCODONE 5 MG immediate release tablet Commonly known as: Oxy IR/ROXICODONE Take 1 tablet (5 mg total) by mouth every 4 (four) hours as needed for severe pain or breakthrough pain.   pregabalin 75 MG capsule Commonly known as: LYRICA Take 1 capsule (75 mg total) by mouth 3 (three) times daily for 14 days.   valACYclovir 500 MG tablet Commonly known as: VALTREX Take 1,000 mg by mouth at bedtime.          Follow-up Information     Pabon, [place] F, MD. Schedule an appointment as soon as possible for a visit in 2 week(s).   Specialty: General Surgery Why: s/p laparosocpic paraesophageal hernia repai, nissen fundoplication, incisional hernia repair Contact information: [address] [place] [address] [phone]                  Time spent on discharge management including discussion of hospital course, clinical condition, outpatient instructions, prescriptions, and follow up with the patient and members of the medical team: >30 minutes  -- Edison Simon , PA-C [facility]  06/10/2021, 8:57 AM [phone] M-F: 7am - 4pm

## 2021-06-10 NOTE — Discharge Instructions (Signed)
In addition to included general post-operative instructions,  Diet: Recommend following Nissen diet for 4 weeks. I attached handouts regarding this.   Activity: No heavy lifting >20 pounds (children, pets, laundry, garbage) or strenuous activity for 6 weeks, but light activity and walking are encouraged. Do not drive or drink alcohol if taking narcotic pain medications or having pain that might distract from driving.  Wound care: 2 days after surgery (12/15), you may shower/get incision wet with soapy water and pat dry (do not rub incisions), but no baths or submerging incision underwater until follow-up.   Medications: Resume all home medications. For mild to moderate pain: acetaminophen (Tylenol) or ibuprofen/naproxen (if no kidney disease). Combining Tylenol with alcohol can substantially increase your risk of causing liver disease. Narcotic pain medications, if prescribed, can be used for severe pain, though may cause nausea, constipation, and drowsiness. Do not combine Tylenol and Percocet (or similar) within a 6 hour period as Percocet (and similar) contain(s) Tylenol. If you do not need the narcotic pain medication, you do not need to fill the prescription.  Call office [phone] / [phone]) at any time if any questions, worsening pain, fevers/chills, bleeding, drainage from incision site, or other concerns.

## 2021-06-15 ENCOUNTER — Telehealth: Payer: Self-pay | Admitting: *Deleted

## 2021-06-15 NOTE — Telephone Encounter (Signed)
Per Dr Dahlia Byes patient to start antireflux measure. He will elevate the head of his bed. No eating after 6 pm. He will start back taking his Prilosec at night. Also antidepressants may also cause heartburn.  He will follow up here as scheduled.

## 2021-06-15 NOTE — Telephone Encounter (Signed)
Patient called and stated that he had hiatal hernia surgery done by Dr Dahlia Byes on 06/09/21. He has been having heartburn at night when he lays down. It started Saturday night but Sunday was the worse. He wants to know should he be concern and can he take anything to help it. Please call and advise

## 2021-06-20 ENCOUNTER — Emergency Department
Admission: EM | Admit: 2021-06-20 | Discharge: 2021-06-20 | Disposition: A | Discharge status: Home / Self Care | Payer: BC Managed Care – PPO | Attending: Emergency Medicine | Admitting: Emergency Medicine

## 2021-06-20 ENCOUNTER — Other Ambulatory Visit: Payer: Self-pay

## 2021-06-20 ENCOUNTER — Encounter: Payer: Self-pay | Admitting: Emergency Medicine

## 2021-06-20 DIAGNOSIS — Z4800 Encounter for change or removal of nonsurgical wound dressing: Secondary | ICD-10-CM | POA: Diagnosis not present

## 2021-06-20 DIAGNOSIS — I10 Essential (primary) hypertension: Secondary | ICD-10-CM | POA: Diagnosis not present

## 2021-06-20 DIAGNOSIS — L03311 Cellulitis of abdominal wall: Secondary | ICD-10-CM | POA: Insufficient documentation

## 2021-06-20 DIAGNOSIS — Z79899 Other long term (current) drug therapy: Secondary | ICD-10-CM | POA: Insufficient documentation

## 2021-06-20 DIAGNOSIS — Z5189 Encounter for other specified aftercare: Secondary | ICD-10-CM

## 2021-06-20 DIAGNOSIS — Z20822 Contact with and (suspected) exposure to covid-19: Secondary | ICD-10-CM | POA: Insufficient documentation

## 2021-06-20 LAB — COMPREHENSIVE METABOLIC PANEL
ALT: 16 U/L (ref 0–44)
AST: 19 U/L (ref 15–41)
Albumin: 4.2 g/dL (ref 3.5–5.0)
Alkaline Phosphatase: 59 U/L (ref 38–126)
Anion gap: 8 (ref 5–15)
BUN: 11 mg/dL (ref 6–20)
CO2: 27 mmol/L (ref 22–32)
Calcium: 9.2 mg/dL (ref 8.9–10.3)
Chloride: 103 mmol/L (ref 98–111)
Creatinine, Ser: 1.15 mg/dL (ref 0.61–1.24)
GFR, Estimated: >60 mL/min (ref >60)
Glucose, Bld: 108 mg/dL — ABNORMAL HIGH (ref 70–99)
Potassium: 4.1 mmol/L (ref 3.5–5.1)
Sodium: 138 mmol/L (ref 135–145)
Total Bilirubin: 0.8 mg/dL (ref 0.3–1.2)
Total Protein: 8.1 g/dL (ref 6.5–8.1)

## 2021-06-20 LAB — CBC WITH DIFFERENTIAL/PLATELET
Abs Immature Granulocytes: 0.03 10*3/uL (ref 0.00–0.07)
Basophils Absolute: 0.1 10*3/uL (ref 0.0–0.1)
Basophils Relative: 0 %
Eosinophils Absolute: 0.3 10*3/uL (ref 0.0–0.5)
Eosinophils Relative: 2 %
HCT: 46 % (ref 39.0–52.0)
Hemoglobin: 16 g/dL (ref 13.0–17.0)
Immature Granulocytes: 0 %
Lymphocytes Relative: 16 %
Lymphs Abs: 2.1 10*3/uL (ref 0.7–4.0)
MCH: 32.4 pg (ref 26.0–34.0)
MCHC: 34.8 g/dL (ref 30.0–36.0)
MCV: 93.1 fL (ref 80.0–100.0)
Monocytes Absolute: 0.8 10*3/uL (ref 0.1–1.0)
Monocytes Relative: 6 %
Neutro Abs: 9.4 10*3/uL — ABNORMAL HIGH (ref 1.7–7.7)
Neutrophils Relative %: 76 %
Platelets: 305 10*3/uL (ref 150–400)
RBC: 4.94 MIL/uL (ref 4.22–5.81)
RDW: 11.9 % (ref 11.5–15.5)
WBC: 12.7 10*3/uL — ABNORMAL HIGH (ref 4.0–10.5)
nRBC: 0 % (ref 0.0–0.2)

## 2021-06-20 LAB — LACTIC ACID, PLASMA: Lactic Acid, Venous: 0.8 mmol/L (ref 0.5–1.9)

## 2021-06-20 LAB — RESP PANEL BY RT-PCR (FLU A&B, COVID) ARPGX2
Influenza A by PCR: NEGATIVE
Influenza B by PCR: NEGATIVE
SARS Coronavirus 2 by RT PCR: NEGATIVE

## 2021-06-20 MED ORDER — CEPHALEXIN 500 MG PO CAPS
500.0000 mg | ORAL_CAPSULE | Freq: Once | ORAL | Status: AC
  Administered 2021-06-20: 16:00:00 500 mg via ORAL
  Filled 2021-06-20: qty 1

## 2021-06-20 MED ORDER — DOXYCYCLINE MONOHYDRATE 100 MG PO TABS: 100.0000 mg | ORAL_TABLET | Freq: Two times a day (BID) | ORAL | 0 refills | Status: DC

## 2021-06-20 MED ORDER — DOXYCYCLINE HYCLATE 100 MG PO TABS
100.0000 mg | ORAL_TABLET | Freq: Once | ORAL | Status: AC
  Administered 2021-06-20: 16:00:00 100 mg via ORAL
  Filled 2021-06-20: qty 1

## 2021-06-20 MED ORDER — FAMOTIDINE 20 MG PO TABS
40.0000 mg | ORAL_TABLET | Freq: Once | ORAL | Status: AC
  Administered 2021-06-20: 16:00:00 40 mg via ORAL
  Filled 2021-06-20: qty 2

## 2021-06-20 MED ORDER — CEPHALEXIN 500 MG PO CAPS: 500.0000 mg | ORAL_CAPSULE | Freq: Four times a day (QID) | ORAL | 0 refills | Status: DC

## 2021-06-20 NOTE — ED Provider Notes (Signed)
[facility]-EMERGENCY DEPARTMENT  ____________________________________________  Time seen: Approximately 4:14 PM  I have reviewed the triage vital signs and the nursing notes.   HISTORY  Chief Complaint Post-op Problem   Historian Patient     HPI Nathan Moody is a 50 y.o. male status post robotic assisted hiatal hernia repair on 06/09/2021 by Dr. Dahlia Byes.  Patient states that he has an allergy to Dermabond and Dermabond was used to close his incisions.  He was told to take Benadryl and apply a topical steroid to Dermabond rash.  Patient states that he has been following directions but rash has become progressively worsening.  He has noticed crusting around incision and developed a low-grade fever today and has had some generalized malaise.  He denies chest pain, chest tightness or abdominal pain.  He states that he has had some   Past Medical History:  Diagnosis Date   Anxiety    Depression    Hypertension    Paraesophageal hernia    Umbilical hernia      Immunizations up to date:  Yes.     Past Medical History:  Diagnosis Date   Anxiety    Depression    Hypertension    Paraesophageal hernia    Umbilical hernia     Patient Active Problem List   Diagnosis Date Noted   S/P repair of paraesophageal hernia 06/09/2021   Primary hypertension 20/25/4270   Umbilical hernia without obstruction and without gangrene 01/08/2019   Psychophysiological insomnia 09/29/2018   Mixed hyperlipidemia 07/03/2018   Overweight 06/30/2018   Healthcare maintenance 02/17/2018   Depression 02/02/2018    Past Surgical History:  Procedure Laterality Date   INSERTION OF MESH  06/09/2021   Procedure: INSERTION OF MESH;  Surgeon: Jules Husbands, MD;  Location: [facility] ORS;  Service: General;;   UMBILICAL HERNIA REPAIR  10/21/2020   and epigastric hernia repair   VENTRAL HERNIA REPAIR N/A 06/09/2021   Procedure: HERNIA REPAIR VENTRAL ADULT, incisional;  Surgeon: Jules Husbands, MD;   Location: [facility] ORS;  Service: General;  Laterality: N/A;   XI ROBOTIC ASSISTED HIATAL HERNIA REPAIR N/A 06/09/2021   Procedure: XI ROBOTIC ASSISTED HIATAL HERNIA REPAIR, RNFA to assist;  Surgeon: Jules Husbands, MD;  Location: [facility] ORS;  Service: General;  Laterality: N/A;    Prior to Admission medications   Medication Sig Start Date End Date Taking? Authorizing Provider  cephALEXin (KEFLEX) 500 MG capsule Take 1 capsule (500 mg total) by mouth 4 (four) times daily for 7 days. 06/20/21 06/27/21 Yes Vallarie Mare M, PA-C  doxycycline (ADOXA) 100 MG tablet Take 1 tablet (100 mg total) by mouth 2 (two) times daily for 7 days. 06/20/21 06/27/21 Yes Vallarie Mare M, PA-C  amLODipine (NORVASC) 5 MG tablet Take 5 mg by mouth daily.    [provider]  buPROPion (WELLBUTRIN XL) 150 MG 24 hr tablet Take 150 mg by mouth daily.    [provider]  busPIRone (BUSPAR) 10 MG tablet Take 10 mg by mouth daily in the afternoon.    [provider]  ibuprofen (ADVIL) 600 MG tablet Take 1 tablet (600 mg total) by mouth every 6 (six) hours as needed. 06/10/21   Tylene Fantasia, PA-C  oxyCODONE (OXY IR/ROXICODONE) 5 MG immediate release tablet Take 1 tablet (5 mg total) by mouth every 4 (four) hours as needed for severe pain or breakthrough pain. 06/10/21   Tylene Fantasia, PA-C  pregabalin (LYRICA) 75 MG capsule Take 1 capsule (75 mg  total) by mouth 3 (three) times daily for 14 days. 06/10/21 06/24/21  Tylene Fantasia, PA-C  valACYclovir (VALTREX) 500 MG tablet Take 1,000 mg by mouth at bedtime.    [provider]    Allergies Patient has no known allergies.  History reviewed. No pertinent family history.  Social History Social History   Tobacco Use   Smoking status: Never   Smokeless tobacco: Never  Vaping Use   Vaping Use: Never used  Substance Use Topics   Alcohol use: Yes    Comment: occassional   Drug use: Never     Review of Systems  Constitutional:  No fever/chills Eyes:  No discharge ENT: No upper respiratory complaints. Respiratory: no cough. No SOB/ use of accessory muscles to breath Gastrointestinal:   No nausea, no vomiting.  No diarrhea.  No constipation. Musculoskeletal: Negative for musculoskeletal pain. Skin: Patient has rash.     ____________________________________________   PHYSICAL EXAM:  VITAL SIGNS: ED Triage Vitals [06/20/21 1400]  Enc Vitals Group     BP 114/86     Pulse Rate 81     Resp 20     Temp 98.2 F (36.8 C)     Temp Source Oral     SpO2 95 %     Weight 222 lb (100.7 kg)     Height 6' 1"  (1.854 m)     Head Circumference      Peak Flow      Pain Score 0     Pain Loc      Pain Edu?      Excl. in [place]?      Constitutional: Alert and oriented. Well appearing and in no acute distress. Eyes: Conjunctivae are normal. PERRL. EOMI. Head: Atraumatic. ENT: Cardiovascular: Normal rate, regular rhythm. Normal S1 and S2.  Good peripheral circulation. Respiratory: Normal respiratory effort without tachypnea or retractions. Lungs CTAB. Good air entry to the bases with no decreased or absent breath sounds Gastrointestinal: Bowel sounds x 4 quadrants. Soft and nontender to palpation. No guarding or rigidity. No distention. Musculoskeletal: Full range of motion to all extremities. No obvious deformities noted Neurologic:  Normal for age. No gross focal neurologic deficits are appreciated.  Skin: Patient has an erythematous, macular, morbilliform appearance to rash surrounding abdominal incisions but has regions of confluent erythema with honey colored crusts. Psychiatric: Mood and affect are normal for age. Speech and behavior are normal.   ____________________________________________   LABS (all labs ordered are listed, but only abnormal results are displayed)  Labs Reviewed  COMPREHENSIVE METABOLIC PANEL - Abnormal; Notable for the following components:      Result Value   Glucose, Bld 108 (*)     All other components within normal limits  CBC WITH DIFFERENTIAL/PLATELET - Abnormal; Notable for the following components:   WBC 12.7 (*)    Neutro Abs 9.4 (*)    All other components within normal limits  RESP PANEL BY RT-PCR (FLU A&B, COVID) ARPGX2  LACTIC ACID, PLASMA  LACTIC ACID, PLASMA  URINALYSIS, ROUTINE W REFLEX MICROSCOPIC   ____________________________________________  EKG   ____________________________________________  RADIOLOGY  No results found.  ____________________________________________    PROCEDURES  Procedure(s) performed:     Procedures     Medications  famotidine (PEPCID) tablet 40 mg (40 mg Oral Given 06/20/21 1608)  doxycycline (VIBRA-TABS) tablet 100 mg (100 mg Oral Given 06/20/21 1608)  cephALEXin (KEFLEX) capsule 500 mg (500 mg Oral Given 06/20/21 1608)     ____________________________________________  INITIAL IMPRESSION / ASSESSMENT AND PLAN / ED COURSE  Pertinent labs & imaging results that were available during my care of the patient were reviewed by me and considered in my medical decision making (see chart for details).      Assessment and plan Wound check 50 year old male presented to the emergency department today with concern for worsening abdominal rash, low-grade fever and generalized malaise.  Vital signs were reassuring at triage.  On physical exam, patient was alert and active and nontoxic-appearing his abdomen was soft and nontender to palpation.  White blood cell count was elevated during this emergency department encounter compared with labs obtained 10 days ago.  Lactic was within reference range.  Patient had a morbilliform rash along abdomen with signs of secondary cellulitis.  He had no palpable induration or fluctuance to suggest developing abscess.  Given recent surgery, fever and generalized malaise at home I advocated for admission.  Patient declined admission at this time stating that he would like to  try a trial of outpatient antibiotic as he has a young son and does not want to miss Christmas.  I discussed the potential of cellulitis worsening and the need for patient to return to the emergency department for admission.  Patient assured me that if his symptoms worsen at home, he has the capacity to return to the emergency department.  He was started on doxycycline to cover him for MRSA and Keflex.  He was given his first dose of each in the emergency department today.  I am concerned about the possibility of steroid cream worsening cellulitis so I had patient stop steroid cream and to apply a barrier emollient such as Vaseline in its place.   Patient did have questions about why his acid reflux has persisted after surgery.  He is taking Prilosec as directed and I recommended that patient add on 40 mg of Pepcid daily as patient states that Pepcid was managing his acid reflux well prior to surgery.  Patient is accompanied by his wife who can help manage medications at home.  Patient was cautioned that should he develop worsening fever, rash, abdominal pain, nausea or vomiting to return to the emergency department.    ____________________________________________  FINAL CLINICAL IMPRESSION(S) / ED DIAGNOSES  Final diagnoses:  Visit for wound check  Cellulitis of abdominal wall      NEW MEDICATIONS STARTED DURING THIS VISIT:  ED Discharge Orders          Ordered    cephALEXin (KEFLEX) 500 MG capsule  4 times daily        06/20/21 1608    doxycycline (ADOXA) 100 MG tablet  2 times daily        06/20/21 1608                This chart was dictated using voice recognition software/Dragon. Despite best efforts to proofread, errors can occur which can change the meaning. Any change was purely unintentional.     Lannie Fields, PA-C 06/20/21 1627    Vanessa McCook, MD 06/20/21 [place] Lanius

## 2021-06-20 NOTE — Discharge Instructions (Addendum)
Please stop topical steroid. You can continue to take Benadryl for itching.  Please also take 40 mg of Pepcid which will help with both itching and acid reflux. You can continue taking your Prilosec as normal. He will take 2 tablets of Keflex in the morning and 2 tablets at night. He will take 1 tablet of doxycycline in the morning and 1 tablet at night. If your symptoms worsen in the next 24 to 48 hours, please return to the emergency department for admission. You can take 650 mg of Tylenol every 4 hours as needed for fever. You can check Covid and Flu results through [facility].  Please start probiotic.

## 2021-06-20 NOTE — ED Triage Notes (Signed)
Pt via POV from home. Pt had hiatal hernia surgery 10 days. Pt states that couple days ago noticed a rash near his incision sites. Pt also states that he has had a fever. Pt took temp this AM and it was 100.6. Pt took Ibuprofen around 12:00pm. Pt is A&OX4 and NAD.

## 2021-06-20 NOTE — ED Notes (Signed)
Dc instructions and scripts reviewed with pt no questions or concerns at this time. Will follow up.

## 2021-06-22 ENCOUNTER — Observation Stay
Admission: EM | Admit: 2021-06-22 | Discharge: 2021-06-22 | Disposition: A | Discharge status: Home / Self Care | Payer: BC Managed Care – PPO | Attending: Emergency Medicine | Admitting: Emergency Medicine

## 2021-06-22 ENCOUNTER — Other Ambulatory Visit: Payer: Self-pay

## 2021-06-22 DIAGNOSIS — I10 Essential (primary) hypertension: Secondary | ICD-10-CM | POA: Insufficient documentation

## 2021-06-22 DIAGNOSIS — Z79899 Other long term (current) drug therapy: Secondary | ICD-10-CM | POA: Insufficient documentation

## 2021-06-22 DIAGNOSIS — R21 Rash and other nonspecific skin eruption: Secondary | ICD-10-CM | POA: Diagnosis present

## 2021-06-22 DIAGNOSIS — L309 Dermatitis, unspecified: Principal | ICD-10-CM | POA: Insufficient documentation

## 2021-06-22 LAB — CBC WITH DIFFERENTIAL/PLATELET
Abs Immature Granulocytes: 0.02 10*3/uL (ref 0.00–0.07)
Basophils Absolute: 0.1 10*3/uL (ref 0.0–0.1)
Basophils Relative: 1 %
Eosinophils Absolute: 0.7 10*3/uL — ABNORMAL HIGH (ref 0.0–0.5)
Eosinophils Relative: 9 %
HCT: 44.5 % (ref 39.0–52.0)
Hemoglobin: 15.4 g/dL (ref 13.0–17.0)
Immature Granulocytes: 0 %
Lymphocytes Relative: 30 %
Lymphs Abs: 2.2 10*3/uL (ref 0.7–4.0)
MCH: 32.2 pg (ref 26.0–34.0)
MCHC: 34.6 g/dL (ref 30.0–36.0)
MCV: 92.9 fL (ref 80.0–100.0)
Monocytes Absolute: 0.6 10*3/uL (ref 0.1–1.0)
Monocytes Relative: 8 %
Neutro Abs: 3.9 10*3/uL (ref 1.7–7.7)
Neutrophils Relative %: 52 %
Platelets: 291 10*3/uL (ref 150–400)
RBC: 4.79 MIL/uL (ref 4.22–5.81)
RDW: 12.1 % (ref 11.5–15.5)
WBC: 7.5 10*3/uL (ref 4.0–10.5)
nRBC: 0 % (ref 0.0–0.2)

## 2021-06-22 LAB — COMPREHENSIVE METABOLIC PANEL
ALT: 18 U/L (ref 0–44)
AST: 21 U/L (ref 15–41)
Albumin: 3.9 g/dL (ref 3.5–5.0)
Alkaline Phosphatase: 60 U/L (ref 38–126)
Anion gap: 3 — ABNORMAL LOW (ref 5–15)
BUN: 10 mg/dL (ref 6–20)
CO2: 26 mmol/L (ref 22–32)
Calcium: 8.4 mg/dL — ABNORMAL LOW (ref 8.9–10.3)
Chloride: 108 mmol/L (ref 98–111)
Creatinine, Ser: 1.31 mg/dL — ABNORMAL HIGH (ref 0.61–1.24)
GFR, Estimated: >60 mL/min (ref >60)
Glucose, Bld: 117 mg/dL — ABNORMAL HIGH (ref 70–99)
Potassium: 3.7 mmol/L (ref 3.5–5.1)
Sodium: 137 mmol/L (ref 135–145)
Total Bilirubin: 0.6 mg/dL (ref 0.3–1.2)
Total Protein: 7.5 g/dL (ref 6.5–8.1)

## 2021-06-22 MED ORDER — SULFAMETHOXAZOLE-TRIMETHOPRIM 800-160 MG PO TABS: 1.0000 | ORAL_TABLET | Freq: Two times a day (BID) | ORAL | 0 refills | Status: AC

## 2021-06-22 MED ORDER — SODIUM CHLORIDE 0.9 % IV SOLN
2.0000 g | Freq: Once | INTRAVENOUS | Status: AC
  Administered 2021-06-22: 13:00:00 2 g via INTRAVENOUS
  Filled 2021-06-22: qty 20

## 2021-06-22 MED ORDER — METHYLPREDNISOLONE SODIUM SUCC 125 MG IJ SOLR
125.0000 mg | Freq: Once | INTRAMUSCULAR | Status: AC
  Administered 2021-06-22: 13:00:00 125 mg via INTRAVENOUS
  Filled 2021-06-22: qty 2

## 2021-06-22 MED ORDER — PREDNISONE 20 MG PO TABS: 60.0000 mg | ORAL_TABLET | Freq: Every day | ORAL | 0 refills | Status: AC

## 2021-06-22 NOTE — Consult Note (Signed)
Date of Consultation:  06/22/2021  Requesting Physician:  Arta Silence, MD  Reason for Consultation:  Abdominal wall rash  History of Present Illness: Nathan Moody is a 50 y.o. male s/p robotic assisted Nissen fundoplication with hiatal hernia repair, epigastric and umbilical hernia repairs with mesh on 06/09/21.  Patient presented to the ER on 12/24 with possible wound infection vs rash.  He reported that he had had a low grade temp at home that day of 100.6.  In the ER, he had an erythematous macular rash surrounding the incisions, had a mildly elevated WBC of 12.7 and as precaution was discharged home on Keflex and Doxycycline, as the patient declined admission.  He has been taking the antibiotics, but has noticed that his rash has worsened and is more widespread, now starting to spread to his arms.  He reports some crustiness at the incisions, but denies any purulent drainage.  Denies any further fevers.  He has tried Benadryl and another antihistamine, but has not really helped much.  Also, he has been having some heartburn after surgery and he was advised to start Prilosec.  Of note, the patient reports recent umbilical hernia repair at [facility] in April 2022, and reports that after his surgery, he had a much milder rash as well.   Past Medical History: Past Medical History:  Diagnosis Date   Anxiety    Depression    Hypertension    Paraesophageal hernia    Umbilical hernia      Past Surgical History: Past Surgical History:  Procedure Laterality Date   INSERTION OF MESH  06/09/2021   Procedure: INSERTION OF MESH;  Surgeon: Jules Husbands, MD;  Location: [facility] ORS;  Service: General;;   UMBILICAL HERNIA REPAIR  10/21/2020   and epigastric hernia repair   VENTRAL HERNIA REPAIR N/A 06/09/2021   Procedure: HERNIA REPAIR VENTRAL ADULT, incisional;  Surgeon: Jules Husbands, MD;  Location: [facility] ORS;  Service: General;  Laterality: N/A;   XI ROBOTIC ASSISTED HIATAL HERNIA REPAIR  N/A 06/09/2021   Procedure: XI ROBOTIC ASSISTED HIATAL HERNIA REPAIR, RNFA to assist;  Surgeon: Jules Husbands, MD;  Location: [facility] ORS;  Service: General;  Laterality: N/A;    Home Medications: Prior to Admission medications   Medication Sig Start Date End Date Taking? Authorizing Provider  predniSONE (DELTASONE) 20 MG tablet Take 3 tablets (60 mg total) by mouth daily with breakfast for 4 days. 06/23/21 06/27/21 Yes Arta Silence, MD  sulfamethoxazole-trimethoprim (BACTRIM DS) 800-160 MG tablet Take 1 tablet by mouth 2 (two) times daily for 5 days. 06/22/21 06/27/21 Yes Arta Silence, MD  amLODipine (NORVASC) 5 MG tablet Take 5 mg by mouth daily.    [provider]  buPROPion (WELLBUTRIN XL) 150 MG 24 hr tablet Take 150 mg by mouth daily.    [provider]  busPIRone (BUSPAR) 10 MG tablet Take 10 mg by mouth daily in the afternoon.    [provider]  ibuprofen (ADVIL) 600 MG tablet Take 1 tablet (600 mg total) by mouth every 6 (six) hours as needed. 06/10/21   Tylene Fantasia, PA-C  oxyCODONE (OXY IR/ROXICODONE) 5 MG immediate release tablet Take 1 tablet (5 mg total) by mouth every 4 (four) hours as needed for severe pain or breakthrough pain. 06/10/21   Tylene Fantasia, PA-C  pregabalin (LYRICA) 75 MG capsule Take 1 capsule (75 mg total) by mouth 3 (three) times daily for 14 days. 06/10/21 06/24/21  Tylene Fantasia, PA-C  valACYclovir (  VALTREX) 500 MG tablet Take 1,000 mg by mouth at bedtime.    [provider]    Allergies: No Known Allergies  Social History:  reports that he has never smoked. He has never used smokeless tobacco. He reports current alcohol use. He reports that he does not use drugs.   Family History: No family history on file.  Review of Systems: Review of Systems  Constitutional:  Negative for chills and fever.  Respiratory:  Negative for shortness of breath.   Cardiovascular:  Negative for chest pain.   Gastrointestinal:  Positive for heartburn. Negative for abdominal pain, nausea and vomiting.  Genitourinary:  Negative for dysuria.  Musculoskeletal:  Negative for myalgias.  Skin:  Positive for itching and rash.  Neurological:  Negative for dizziness.  Psychiatric/Behavioral:  Negative for depression.    Physical Exam BP 124/74 (BP Location: Right Arm)    Pulse 81    Temp 98.1 F (36.7 C) (Oral)    Resp 20    Ht 6' 1"  (1.854 m)    Wt 101.2 kg    SpO2 98%    BMI 29.42 kg/m  CONSTITUTIONAL: No acute distress HEENT:  Normocephalic, atraumatic, extraocular motion intact. NECK: Trachea is midline, and there is no jugular venous distension. RESPIRATORY:  Normal respiratory effort without pathologic use of accessory muscles. CARDIOVASCULAR: Regular rhythm and rate. GI: The abdomen is soft, non-distended, non-tender to palpation.  Patient has maculopapular rash over his abdominal wall, mostly centered around each of the incisions and also somewhat at the infraumbilical area.  There was dermabond in place which I removed revealing clean incisions, two with some minimal 1 mm superficial dehiscence.  No purulent drainage from any incision.  No induration or true cellulitis.  MUSCULOSKELETAL:  Normal muscle strength and tone in all four extremities.  No peripheral edema or cyanosis. SKIN: Skin turgor is normal. There are no pathologic skin lesions.  NEUROLOGIC:  Motor and sensation is grossly normal.  Cranial nerves are grossly intact. PSYCH:  Alert and oriented to person, place and time. Affect is normal.  Laboratory Analysis: Results for orders placed or performed during the hospital encounter of 06/22/21 (from the past 24 hour(s))  CBC with Differential     Status: Abnormal   Collection Time: 06/22/21  8:23 AM  Result Value Ref Range   WBC 7.5 4.0 - 10.5 K/uL   RBC 4.79 4.22 - 5.81 MIL/uL   Hemoglobin 15.4 13.0 - 17.0 g/dL   HCT 44.5 39.0 - 52.0 %   MCV 92.9 80.0 - 100.0 fL   MCH 32.2 26.0  - 34.0 pg   MCHC 34.6 30.0 - 36.0 g/dL   RDW 12.1 11.5 - 15.5 %   Platelets 291 150 - 400 K/uL   nRBC 0.0 0.0 - 0.2 %   Neutrophils Relative % 52 %   Neutro Abs 3.9 1.7 - 7.7 K/uL   Lymphocytes Relative 30 %   Lymphs Abs 2.2 0.7 - 4.0 K/uL   Monocytes Relative 8 %   Monocytes Absolute 0.6 0.1 - 1.0 K/uL   Eosinophils Relative 9 %   Eosinophils Absolute 0.7 (H) 0.0 - 0.5 K/uL   Basophils Relative 1 %   Basophils Absolute 0.1 0.0 - 0.1 K/uL   Immature Granulocytes 0 %   Abs Immature Granulocytes 0.02 0.00 - 0.07 K/uL  Comprehensive metabolic panel     Status: Abnormal   Collection Time: 06/22/21  8:23 AM  Result Value Ref Range   Sodium 137 135 -  145 mmol/L   Potassium 3.7 3.5 - 5.1 mmol/L   Chloride 108 98 - 111 mmol/L   CO2 26 22 - 32 mmol/L   Glucose, Bld 117 (H) 70 - 99 mg/dL   BUN 10 6 - 20 mg/dL   Creatinine, Ser 1.31 (H) 0.61 - 1.24 mg/dL   Calcium 8.4 (L) 8.9 - 10.3 mg/dL   Total Protein 7.5 6.5 - 8.1 g/dL   Albumin 3.9 3.5 - 5.0 g/dL   AST 21 15 - 41 U/L   ALT 18 0 - 44 U/L   Alkaline Phosphatase 60 38 - 126 U/L   Total Bilirubin 0.6 0.3 - 1.2 mg/dL   GFR, Estimated >60 >60 mL/min   Anion gap 3 (L) 5 - 15    Imaging: No results found.  Assessment and Plan: This is a 50 y.o. male presenting to ER for possible worsening wound infection vs worsening rash.  -- Discussed with the patient that the appearance, pattern, and spread of this is more suggestive of an allergic reaction rather than infection.  It would be very uncommon for all wounds to have infection, and the appearance is more rash like than cellulitis or induration.  There is also no worse tenderness but rather itching.  However, he had been on antibiotics two days prior.  As a precaution, advised that we could do a different antibiotic in case this could be a low grade infection, but would also recommend a steroid course for his rash.   -- Discussed that this could be allergy to Dermabond or to Chloroprep  used prior to surgery.  This would be consistent with the prior umbilical hernia repair he had at [facility] at which he also developed a much milder rash. -- Will coordinate close follow up in our office to check on his wounds and progress.   Melvyn Neth, MD [facility] Pg:  [phone]

## 2021-06-22 NOTE — ED Triage Notes (Signed)
Pt states he has hiatal hernia repair on 12/13, was having a rash on his torso and inflamed incisions, states he came in 12/24 for rash and was told to come back if not improving states the past is now on his arm and the incisions are still inflamed, pt is in NAD, still taking abx,.

## 2021-06-22 NOTE — Discharge Instructions (Addendum)
We suspect that the rash on your abdomen is more likely an allergic reaction than cellulitis or an infection.  Discontinue the doxycycline and cephalexin.  Take the Bactrim (TMP/SMX) as prescribed.  Take the prednisone starting tomorrow.  Continue either Benadryl or hydroxyzine as well as Pepcid.  Follow-up with Dr. Dahlia Byes on Thursday as discussed.  In the meantime, return to the ER immediately for new, worsening, or persistent severe rash, itching, if the rash spreads to other areas, fever or chills, weakness, pus or other drainage from the wounds, or any other new or worsening symptoms that concern you.

## 2021-06-22 NOTE — ED Provider Notes (Signed)
[facility] Emergency Department Provider Note ____________________________________________   Event Date/Time   First MD Initiated Contact with Patient 06/22/21 1142     (approximate)  I have reviewed the triage vital signs and the nursing notes.   HISTORY  Chief Complaint Post-op Problem    HPI Nathan Moody is a 50 y.o. male with PMH as noted below status post paraesophageal hernia repair on 12/13 who presents with persistent rash to his abdomen, now with rash spreading to his upper torso and arms.  The patient states that he was seen in the ED 2 days ago and diagnosed with possible cellulitis versus an allergic reaction.  He was started on doxycycline.  He also has been taking Benadryl and hydroxyzine.  He states that the original area of the rash on his abdomen that was evaluated 2 days ago has not improved at all, now the rash and hives have spread to his torso and arms.  He describes them as very itchy.  He has no fever or chills.  He has no drainage coming out of the wounds.   Past Medical History:  Diagnosis Date   Anxiety    Depression    Hypertension    Paraesophageal hernia    Umbilical hernia     Patient Active Problem List   Diagnosis Date Noted   Rash and nonspecific skin eruption 06/22/2021   S/P repair of paraesophageal hernia 06/09/2021   Primary hypertension 37/16/9678   Umbilical hernia without obstruction and without gangrene 01/08/2019   Psychophysiological insomnia 09/29/2018   Mixed hyperlipidemia 07/03/2018   Overweight 06/30/2018   Healthcare maintenance 02/17/2018   Depression 02/02/2018    Past Surgical History:  Procedure Laterality Date   INSERTION OF MESH  06/09/2021   Procedure: INSERTION OF MESH;  Surgeon: Jules Husbands, MD;  Location: [facility] ORS;  Service: General;;   UMBILICAL HERNIA REPAIR  10/21/2020   and epigastric hernia repair   VENTRAL HERNIA REPAIR N/A 06/09/2021   Procedure: HERNIA REPAIR VENTRAL  ADULT, incisional;  Surgeon: Jules Husbands, MD;  Location: [facility] ORS;  Service: General;  Laterality: N/A;   XI ROBOTIC ASSISTED HIATAL HERNIA REPAIR N/A 06/09/2021   Procedure: XI ROBOTIC ASSISTED HIATAL HERNIA REPAIR, RNFA to assist;  Surgeon: Jules Husbands, MD;  Location: [facility] ORS;  Service: General;  Laterality: N/A;    Prior to Admission medications   Medication Sig Start Date End Date Taking? Authorizing Provider  predniSONE (DELTASONE) 20 MG tablet Take 3 tablets (60 mg total) by mouth daily with breakfast for 4 days. 06/23/21 06/27/21 Yes Arta Silence, MD  sulfamethoxazole-trimethoprim (BACTRIM DS) 800-160 MG tablet Take 1 tablet by mouth 2 (two) times daily for 5 days. 06/22/21 06/27/21 Yes Arta Silence, MD  amLODipine (NORVASC) 5 MG tablet Take 5 mg by mouth daily.    [provider]  buPROPion (WELLBUTRIN XL) 150 MG 24 hr tablet Take 150 mg by mouth daily.    [provider]  busPIRone (BUSPAR) 10 MG tablet Take 10 mg by mouth daily in the afternoon.    [provider]  ibuprofen (ADVIL) 600 MG tablet Take 1 tablet (600 mg total) by mouth every 6 (six) hours as needed. 06/10/21   Tylene Fantasia, PA-C  oxyCODONE (OXY IR/ROXICODONE) 5 MG immediate release tablet Take 1 tablet (5 mg total) by mouth every 4 (four) hours as needed for severe pain or breakthrough pain. 06/10/21   Tylene Fantasia, PA-C  pregabalin (LYRICA) 75 MG  capsule Take 1 capsule (75 mg total) by mouth 3 (three) times daily for 14 days. 06/10/21 06/24/21  Tylene Fantasia, PA-C  valACYclovir (VALTREX) 500 MG tablet Take 1,000 mg by mouth at bedtime.    [provider]    Allergies Patient has no known allergies.  No family history on file.  Social History Social History   Tobacco Use   Smoking status: Never   Smokeless tobacco: Never  Vaping Use   Vaping Use: Never used  Substance Use Topics   Alcohol use: Yes    Comment: occassional   Drug use: Never     Review of Systems  Constitutional: No fever/chills Eyes: No visual changes. ENT: No sore throat. Cardiovascular: Denies chest pain. Respiratory: Denies shortness of breath. Gastrointestinal: No vomiting or diarrhea.  Genitourinary: Negative for dysuria.  Musculoskeletal: Negative for back pain. Skin: Positive for rash. Neurological: Negative for headaches, focal weakness or numbness.   ____________________________________________   PHYSICAL EXAM:  VITAL SIGNS: ED Triage Vitals  Enc Vitals Group     BP 06/22/21 0819 122/85     Pulse Rate 06/22/21 0819 78     Resp 06/22/21 0819 16     Temp 06/22/21 0819 98.1 F (36.7 C)     Temp Source 06/22/21 0819 Oral     SpO2 06/22/21 0819 96 %     Weight 06/22/21 0821 223 lb (101.2 kg)     Height 06/22/21 0821 6' 1"  (1.854 m)     Head Circumference --      Peak Flow --      Pain Score 06/22/21 0821 0     Pain Loc --      Pain Edu? --      Excl. in [place]? --     Constitutional: Alert and oriented. Well appearing and in no acute distress. Eyes: Conjunctivae are normal.  Head: Atraumatic. Nose: No congestion/rhinnorhea. Mouth/Throat: Mucous membranes are moist.   Neck: Normal range of motion.  Cardiovascular: Normal rate, regular rhythm. Good peripheral circulation. Respiratory: Normal respiratory effort.  No retractions.  Gastrointestinal: Soft and nontender. No distention.  Musculoskeletal: No lower extremity edema.  Extremities warm and well perfused.  Neurologic:  Normal speech and language. No gross focal neurologic deficits are appreciated.  Skin: Abdominal incision sites clean, dry, intact with no purulent drainage.  Several areas of erythema and surrounding the abdominal wounds with no induration, fluctuance or palpable masses.  Blanching, papular rash to upper abdomen, with multiple papules on the anterior chest, and scattered papules on bilateral arms.  No rash below the lower abdomen or on the lower extremities.  No  palm/sole or mucosal involvement. Psychiatric: Mood and affect are normal. Speech and behavior are normal.  ____________________________________________   LABS (all labs ordered are listed, but only abnormal results are displayed)  Labs Reviewed  CBC WITH DIFFERENTIAL/PLATELET - Abnormal; Notable for the following components:      Result Value   Eosinophils Absolute 0.7 (*)    All other components within normal limits  COMPREHENSIVE METABOLIC PANEL - Abnormal; Notable for the following components:   Glucose, Bld 117 (*)    Creatinine, Ser 1.31 (*)    Calcium 8.4 (*)    Anion gap 3 (*)    All other components within normal limits  CULTURE, BLOOD (ROUTINE X 2)   ____________________________________________  EKG   ____________________________________________  RADIOLOGY    ____________________________________________   PROCEDURES  Procedure(s) performed: No  Procedures  Critical Care performed: No ____________________________________________  INITIAL IMPRESSION / ASSESSMENT AND PLAN / ED COURSE  Pertinent labs & imaging results that were available during my care of the patient were reviewed by me and considered in my medical decision making (see chart for details).  50 year old male with PMH as noted above presents with persistent and worsening rash to the abdomen, torso, and arms.  I reviewed the past medical records in Epic and confirmed that the patient had surgery on 12/13 which was uncomplicated and he was discharged the next day.  He returned to the ED 2 days ago with the current rash which was diagnosed as likely cellulitis and admission was recommended but the patient decided to go home.  He was treated with doxycycline.  He also has been taking Benadryl and hydroxyzine.  On exam the vital signs are normal and the patient is well-appearing.  There is a rash as described above with areas of raised, blanching erythema and around multiple surgical sites and then  more scattered papules/urticaria to the upper abdomen, chest, and arms.  Lab work-up is unremarkable and the patient's WBC count has improved.  Overall presentation is most consistent with allergic etiology/dermatitis, likely from Dermabond.  There is no evidence of abscess or other fluid collection.  The wounds themselves do not appear to be infected and are not draining.  There could also be a component of a drug eruption given the spreading papules/urticaria to the upper torso.  I have a lower suspicion for abdominal wall cellulitis given the appearance of the rash and the fact that that the patient's WBC count has improved.  I will consult Dr. Hampton Abbot from general surgery; we may consider admission for steroids as well as IV antibiotics although overall the patient appears well and may be appropriate for outpatient treatment.  ----------------------------------------- 1:12 PM on 06/22/2021 -----------------------------------------  Dr. Hampton Abbot from general surgery has evaluated the patient.  He advises that based on his examination the presentation is much more consistent with an allergic dermatitis rather than cellulitis or any wound infection.  He recommends that we give a steroid burst and discontinue the cephalexin and doxycycline, switching to Bactrim for coverage for possible (although less likely) cellulitis.  Per Dr. Hampton Abbot, the patient can follow-up with Dr. Dahlia Byes in 3 days.  The patient appears well with normal vital signs and is tolerating p.o.  He feels comfortable going home.  I counseled him on the results of the work-up and my discussion with Dr. Hampton Abbot.  I explained treatment and follow-up plans.  I gave the patient very thorough return precautions and he expressed understanding.   ____________________________________________   FINAL CLINICAL IMPRESSION(S) / ED DIAGNOSES  Final diagnoses:  Dermatitis      NEW MEDICATIONS STARTED DURING THIS VISIT:  New Prescriptions    PREDNISONE (DELTASONE) 20 MG TABLET    Take 3 tablets (60 mg total) by mouth daily with breakfast for 4 days.   SULFAMETHOXAZOLE-TRIMETHOPRIM (BACTRIM DS) 800-160 MG TABLET    Take 1 tablet by mouth 2 (two) times daily for 5 days.     Note:  This document was prepared using Dragon voice recognition software and may include unintentional dictation errors.    Arta Silence, MD 06/22/21 1316

## 2021-06-25 ENCOUNTER — Ambulatory Visit (INDEPENDENT_AMBULATORY_CARE_PROVIDER_SITE_OTHER): Payer: BC Managed Care – PPO | Admitting: Surgery

## 2021-06-25 ENCOUNTER — Other Ambulatory Visit: Payer: Self-pay

## 2021-06-25 ENCOUNTER — Encounter: Payer: Self-pay | Admitting: Surgery

## 2021-06-25 VITALS — BP 125/85 | HR 79 | Temp 98.4°F | Ht 73.0 in | Wt 224.0 lb

## 2021-06-25 DIAGNOSIS — Z09 Encounter for follow-up examination after completed treatment for conditions other than malignant neoplasm: Secondary | ICD-10-CM

## 2021-06-25 DIAGNOSIS — K432 Incisional hernia without obstruction or gangrene: Secondary | ICD-10-CM

## 2021-06-25 DIAGNOSIS — K449 Diaphragmatic hernia without obstruction or gangrene: Secondary | ICD-10-CM

## 2021-06-25 NOTE — Patient Instructions (Signed)
Please see your follow up appointment listed below.

## 2021-06-25 NOTE — Progress Notes (Signed)
Outpatient Surgical Follow Up  06/25/2021  Nathan Moody is an 50 y.o. male.   Chief Complaint  Patient presents with   Routine Post Op    Nissen fundoplication    HPI: s/p Robotic Nissen and HH repair. Doing well overall but had significant t rash from allergic reaction, No fevers or chills. Minimal abd pain. No emesis. Does feel some heartburn , it is improving w PPI  Past Medical History:  Diagnosis Date   Anxiety    Depression    Hypertension    Paraesophageal hernia    Umbilical hernia     Past Surgical History:  Procedure Laterality Date   INSERTION OF MESH  06/09/2021   Procedure: INSERTION OF MESH;  Surgeon: Jules Husbands, MD;  Location: [facility] ORS;  Service: General;;   UMBILICAL HERNIA REPAIR  10/21/2020   and epigastric hernia repair   VENTRAL HERNIA REPAIR N/A 06/09/2021   Procedure: HERNIA REPAIR VENTRAL ADULT, incisional;  Surgeon: Jules Husbands, MD;  Location: [facility] ORS;  Service: General;  Laterality: N/A;   XI ROBOTIC ASSISTED HIATAL HERNIA REPAIR N/A 06/09/2021   Procedure: XI ROBOTIC ASSISTED HIATAL HERNIA REPAIR, RNFA to assist;  Surgeon: Jules Husbands, MD;  Location: [facility] ORS;  Service: General;  Laterality: N/A;    History reviewed. No pertinent family history.  Social History:  reports that he has never smoked. He has never used smokeless tobacco. He reports current alcohol use. He reports that he does not use drugs.  Allergies:  Allergies  Allergen Reactions   Chlorhexidine Gluconate     rash    Medications reviewed.    ROS Full ROS performed and is otherwise negative other than what is stated in HPI   BP 125/85    Pulse 79    Temp 98.4 F (36.9 C) (Oral)    Ht 6' 1"  (1.854 m)    Wt 224 lb (101.6 kg)    SpO2 94%    BMI 29.55 kg/m   Physical Exam  NAD, alert Abd: soft, incision healing well, no infection. Fading rash from xiphoid to pubis and from flank to flank c/w allergic reaction / dermatitis from Chloraprep. No evidence of  incisional hernia recurrrence   Assessment/Plan: allergic reaction / dermatitis from Chloraprep Functional GI issues likely related to antidepressants He is working w Psych to achieve a happy medium RTC 2-3 weeks  Caroleen Hamman, MD [facility]

## 2021-06-27 LAB — CULTURE, BLOOD (ROUTINE X 2)
Culture: NO GROWTH
Special Requests: ADEQUATE

## 2021-07-01 ENCOUNTER — Telehealth: Payer: Self-pay | Admitting: *Deleted

## 2021-07-01 ENCOUNTER — Encounter: Payer: BC Managed Care – PPO | Admitting: Surgery

## 2021-07-01 NOTE — Telephone Encounter (Signed)
Patient called and was seen on 06/25/21 by Thedore Mins for a follow up from surgery on 06/09/21 Dr Dahlia Byes (Hiatal Hernia), He had a allergic reaction for the stuff they clean him with during surgery, he went to the ER on 06/22/21 about the rash and they gave him antibiotic but he couldn't remember the name of it, he is completely finished with the antibiotics and now his rash is spreading to his back. He wants to see if he needs to take something else for this.Please call and advise

## 2021-07-02 ENCOUNTER — Ambulatory Visit: Payer: BC Managed Care – PPO

## 2021-07-20 ENCOUNTER — Other Ambulatory Visit: Payer: Self-pay

## 2021-07-20 ENCOUNTER — Ambulatory Visit (INDEPENDENT_AMBULATORY_CARE_PROVIDER_SITE_OTHER): Payer: BC Managed Care – PPO | Admitting: Physician Assistant

## 2021-07-20 ENCOUNTER — Encounter: Payer: Self-pay | Admitting: Physician Assistant

## 2021-07-20 VITALS — BP 118/87 | HR 77 | Temp 98.3°F | Ht 73.0 in | Wt 224.0 lb

## 2021-07-20 DIAGNOSIS — K449 Diaphragmatic hernia without obstruction or gangrene: Secondary | ICD-10-CM

## 2021-07-20 DIAGNOSIS — Z09 Encounter for follow-up examination after completed treatment for conditions other than malignant neoplasm: Secondary | ICD-10-CM

## 2021-07-20 NOTE — Progress Notes (Signed)
[facility] POST-OP OFFICE VISIT  07/20/2021  HPI: Nathan Moody is a 51 y.o. male ~ 6 weeks s/p laparoscopic paraesophageal hernia repair and nissen fundoplication with Dr Dahlia Byes  He is overall doing well He had some issues with reflux initially but this resolved and has had only one episode I nthe last few weeks. He feels that lactose is a trigger No abdominal pain, nausea, emesis, or trouble swallowing Otherwise doing well  Vital signs: BP 118/87    Pulse 77    Temp 98.3 F (36.8 C)    Ht 6' 1"  (1.854 m)    Wt 224 lb (101.6 kg)    SpO2 97%    BMI 29.55 kg/m    Physical Exam: Constitutional: Well appearing male, NAD Abdomen: Soft, non-tender, non-distended, no rebound/guarding Skin: Laparoscopic incisions are well healed  Assessment/Plan: This is a 51 y.o. male ~ 6 weeks s/p laparoscopic paraesophageal hernia repair and nissen fundoplication   - Okay to resume regular diet  - He has completed lifting restrictions  - No other issues  - He can follow up in ~3 months   -- Edison Simon, PA-C [facility] 07/20/2021, 9:02 AM [phone] M-F: 7am - 4pm

## 2021-07-20 NOTE — Patient Instructions (Signed)

## 2021-10-12 ENCOUNTER — Encounter: Payer: Self-pay | Admitting: Surgery

## 2021-10-12 ENCOUNTER — Ambulatory Visit: Payer: BC Managed Care – PPO | Admitting: Surgery

## 2021-10-12 VITALS — BP 103/67 | HR 80 | Temp 97.8°F | Ht 72.0 in | Wt 198.4 lb

## 2021-10-12 DIAGNOSIS — R1310 Dysphagia, unspecified: Secondary | ICD-10-CM | POA: Diagnosis not present

## 2021-10-12 DIAGNOSIS — K449 Diaphragmatic hernia without obstruction or gangrene: Secondary | ICD-10-CM | POA: Diagnosis not present

## 2021-10-12 NOTE — Progress Notes (Signed)
Outpatient Surgical Follow Up ? ?10/12/2021 ? ?Nathan Moody is an 51 y.o. male.  ? ?Chief Complaint  ?Patient presents with  ? Follow-up  ?  Umbilical and hiatal hernia repair 06/09/21  ? ? ?HPI: Triplet is a 51 year old male well-known to me 4 months after robotic paraesophageal hernia repair with incisional hernia.  He had uneventful immediate postoperative course and a few weeks ago I started having issues with following solid meals.  He states that he feels that some of the food gets stuck in his chest.  Does have some regurgitation.  He also reports some dyspnea on exertion.  No fevers and chills.  No abdominal pain.  He has had at least 25 pounds of weight loss and this has been intentional. ?Since his surgery he has  had 0 reflux issues ? ?Past Medical History:  ?Diagnosis Date  ? Anxiety   ? Depression   ? Hypertension   ? Paraesophageal hernia   ? Umbilical hernia   ? ? ?Past Surgical History:  ?Procedure Laterality Date  ? INSERTION OF MESH  06/09/2021  ? Procedure: INSERTION OF MESH;  Surgeon: Jules Husbands, MD;  Location: [facility] ORS;  Service: General;;  ? UMBILICAL HERNIA REPAIR  10/21/2020  ? and epigastric hernia repair  ? VENTRAL HERNIA REPAIR N/A 06/09/2021  ? Procedure: HERNIA REPAIR VENTRAL ADULT, incisional;  Surgeon: Jules Husbands, MD;  Location: [facility] ORS;  Service: General;  Laterality: N/A;  ? XI ROBOTIC ASSISTED HIATAL HERNIA REPAIR N/A 06/09/2021  ? Procedure: XI ROBOTIC ASSISTED HIATAL HERNIA REPAIR, RNFA to assist;  Surgeon: Jules Husbands, MD;  Location: [facility] ORS;  Service: General;  Laterality: N/A;  ? ? ?History reviewed. No pertinent family history. ? ?Social History:  reports that he has never smoked. He has never used smokeless tobacco. He reports current alcohol use. He reports that he does not use drugs. ? ?Allergies:  ?Allergies  ?Allergen Reactions  ? Chlorhexidine Gluconate   ?  rash  ? ? ?Medications reviewed. ? ? ? ?ROS ?Full ROS performed and is otherwise negative other  than what is stated in HPI ? ? ?BP 103/67   Pulse 80   Temp 97.8 ?F (36.6 ?C) (Oral)   Ht 6' (1.829 m)   Wt 198 lb 6.4 oz (90 kg)   SpO2 98%   BMI 26.91 kg/m?  ? ?Physical Exam ?Vitals and nursing note reviewed. Exam conducted with a chaperone present.  ?Constitutional:   ?   General: He is not in acute distress. ?   Appearance: Normal appearance. He is normal weight. He is not ill-appearing.  ?Eyes:  ?   General: No scleral icterus.    ?   Right eye: No discharge.     ?   Left eye: No discharge.  ?Cardiovascular:  ?   Rate and Rhythm: Normal rate and regular rhythm.  ?   Heart sounds: No murmur heard. ?Pulmonary:  ?   Effort: Pulmonary effort is normal. No respiratory distress.  ?   Breath sounds: Normal breath sounds. No stridor. No rhonchi.  ?Abdominal:  ?   General: Abdomen is flat. There is no distension.  ?   Palpations: Abdomen is soft. There is no mass.  ?   Tenderness: There is no abdominal tenderness. There is no guarding.  ?   Hernia: No hernia is present.  ?   Comments: Robotic incisions healing well without evidence of infection I do not feel any recurrent abd wall hernias  ?  Musculoskeletal:     ?   General: No swelling, tenderness or deformity. Normal range of motion.  ?   Cervical back: Normal range of motion and neck supple. No rigidity or tenderness.  ?Skin: ?   General: Skin is warm and dry.  ?Neurological:  ?   General: No focal deficit present.  ?   Mental Status: He is alert and oriented to person, place, and time.  ?Psychiatric:     ?   Mood and Affect: Mood normal.     ?   Behavior: Behavior normal.     ?   Thought Content: Thought content normal.     ?   Judgment: Judgment normal.  ? ? ?Assessment/Plan: ?51 year old male four months out from paraesophageal hernia repair with recurrent ventral hernia.  Now with some bowel and discomfort.  Do think that we need to interrogate this further we will start with a CT scan of the abdomen pelvis and also perform a swallow study. It is unusual to  have swallowing issues after a few months out. Differential will include recurrence , edema. ?I spent 30 minutes in this encounter including personally reviewing prior imaging studies, coordinating his care, placing orders and performing appropriate documentation ? ?Caroleen Hamman, MD FACS ?General Surgeon  ?

## 2021-10-12 NOTE — Patient Instructions (Addendum)
Your CT is scheduled for Wednesday 10/21/2021 @ [facility] at 2 pm (arrive by 1:45 pm). Nothing to eat or drink 4 hours priors. Please pick up contrast between now and the day before.  ? ?Your Barium Swallow test is scheduled for Monday 10/26/2021 @ [facility] ?At 9 am (arrive by 8:30 am). Nothing to eat or drink 3 hours prior.  ? ?If you have any concerns or questions, please feel free to call our office. See follow up appointment below. ? ?Dysphagia Eating Plan, Bite Size Food ?This eating plan is for people with moderate swallowing problems who have transitioned from pureed and minced foods. Bite size foods are soft and cut into small chunks so that they can be swallowed safely. On this eating plan, you may be instructed to drink liquids that are thickened. ?Work with your health care provider and your diet and nutrition specialist (dietitian) to make sure that you are following the eating plan safely and getting all the nutrients you need. ?What are tips for following this plan? ?General information ? ?You may eat foods that are tender, soft, and moist. ?Always test food texture before taking a bite. Poke food with a fork or spoon to make sure it is tender. The test sample should squash, break apart, or change shape, and it should not return to its original shape when the fork or spoon is removed. ?Food should be easy to cut and chew. Avoid large pieces of food that require a lot of chewing. ?Take small bites. Each bite should be smaller than your thumb nail (about 15 mm by 15 mm). ?If you were on a pureed or minced food eating plan, you may eat any of the foods included in those diets. ?Avoid foods that are very dry, hard, sticky, chewy, coarse, or crunchy. ?If instructed by your health care provider, thicken liquids. Follow your health care provider's instructions for what products to use, how to do this, and to what thickness. ?Your health care provider may recommend using a commercial thickener, rice cereal, or potato  flakes. Ask your health care provider to recommend thickeners. ?Thickened liquids are usually a "pudding-like" consistency, or they may be as thick as honey or thick enough to eat with a spoon. ?Cooking ?To moisten foods, you may add liquids while you are blending, mashing, or grinding your foods to the right consistency. These liquids include gravies, sauces, vegetable or fruit juice, milk, half and half, or water. ?Strain extra liquid from foods before eating. ?Reheat foods slowly to prevent a tough crust from forming. ?Prepare foods in advance. ?Meal planning ?Eat a variety of foods to get all the nutrients you need. ?Some foods may be tolerated better than others. Work with your dietitian to identify which foods are safest for you to eat. ?Follow your meal plan as told by your dietitian. ?What foods can I eat? ?Grains ?Moist breads without nuts or seeds. Biscuits, muffins, pancakes, and waffles that are well-moistened with syrup, jelly, margarine, or butter. Cooked cereals. Moist bread stuffing. Moist rice. Well-moistened cold cereal with small chunks. Well-cooked pasta, noodles, rice, and bread dressing in small pieces and thick sauce. Soft dumplings or spaetzle in small pieces and butter or gravy. ?Fruits ?Canned or cooked fruits that are soft or moist and do not have skin or seeds. Fresh, soft bananas. Thickened fruit juices. ?Vegetables ?Soft, well-cooked vegetables in small pieces. Soft-cooked, mashed potatoes. Thickened vegetable juice. ?Meats and other proteins ?Tender, moist meats or poultry in small pieces. Moist meatballs or meatloaf.  Fish without bones. Eggs or egg substitutes in small pieces. Tofu. [place] and meat alternatives in small pieces. Well-cooked, tender beans, peas, baked beans, and other legumes. ?Dairy ?Thickened milk. Cream cheese. Yogurt. Cottage cheese. Sour cream. Small pieces of soft cheese. ?Fats and oils ?Butter. Oils. Margarine. Mayonnaise. Gravy. Spreads. ?Sweets and  desserts ?Soft, smooth, moist desserts. Pudding. Custard. Moist cakes. Jam. Jelly. Honey. Preserves. Ask your health care provider whether you can have frozen desserts. ?Seasonings and other foods ?All seasonings and sweeteners. All sauces with small chunks. Prepared tuna, egg, or chicken salad without raw fruits or vegetables. Moist casseroles with small, tender pieces of meat. Soups with tender meat. ?The items listed above may not be a complete list of foods and beverages you can eat. Contact a dietitian for more information. ?What foods must I avoid? ?Grains ?Coarse or dry cereals. Dry breads. Toast. Crackers. Tough, crusty breads, such as [place] bread and baguettes. Dry pancakes, waffles, and muffins. Sticky rice. Dry bread stuffing. Granola. Popcorn. Chips. ?Fruits ?Hard, crunchy, stringy, high-pulp, and juicy raw fruits such as apples, pineapple, papaya, and watermelon. Small, round fruits, such as grapes. Dried fruit and fruit leather. ?Vegetables ?All raw vegetables. Cooked corn. Rubbery or stiff cooked vegetables. Stringy vegetables, such as celery. Tough, crisp fried potatoes. Potato skins. ?Meats and other proteins ?Large pieces of meat. Dry, tough meats, such as bacon, sausage, and hot dogs. Chicken, [place], or fish with skin and bones. Crunchy peanut butter. Nuts. Seeds. Nut and seed butters. ?Dairy ?Yogurt with nuts, seeds, or large chunks. Large chunks of cheese. ?Sweets and desserts ?Dry cakes. Chewy or dry cookies. Any desserts with nuts, seeds, dry fruits, coconut, pineapple, or anything dry, sticky, or hard. Chewy caramel. Licorice. Taffy-type candies. Ask your health care provider whether you can have frozen desserts. ?Seasonings and other foods ?Soups with tough or large chunks of meats, poultry, or vegetables. Corn or clam chowder. Smoothies with large chunks of fruit. ?The items listed above may not be a complete list of foods and beverages you should avoid. Contact a dietitian for more  information. ?Summary ?Bite size foods can be helpful for people with moderate swallowing problems. ?On this dysphagia eating plan, you may eat foods that are soft, moist, and cut into pieces smaller than your thumb nail (about 15 mm by 15 mm). ?You may be instructed to thicken liquids. Follow your health care provider's instructions about how to do this and to what consistency. ?This information is not intended to replace advice given to you by your health care provider. Make sure you discuss any questions you have with your health care provider. ?Document Revised: 03/03/2020 Document Reviewed: 03/03/2020 ?Elsevier Patient Education ? [place]. ? ?

## 2021-10-21 ENCOUNTER — Ambulatory Visit
Admission: RE | Admit: 2021-10-21 | Discharge: 2021-10-21 | Disposition: A | Discharge status: Home / Self Care | Payer: BC Managed Care – PPO | Source: Ambulatory Visit | Attending: Surgery | Admitting: Surgery

## 2021-10-21 DIAGNOSIS — R1310 Dysphagia, unspecified: Secondary | ICD-10-CM | POA: Insufficient documentation

## 2021-10-21 MED ORDER — IOHEXOL 300 MG/ML  SOLN
100.0000 mL | Freq: Once | INTRAMUSCULAR | Status: AC | PRN
  Administered 2021-10-21: 100 mL via INTRAVENOUS

## 2021-10-26 ENCOUNTER — Ambulatory Visit
Admission: RE | Admit: 2021-10-26 | Discharge: 2021-10-26 | Disposition: A | Discharge status: Home / Self Care | Payer: BC Managed Care – PPO | Source: Ambulatory Visit | Attending: Surgery | Admitting: Surgery

## 2021-10-26 ENCOUNTER — Telehealth: Payer: Self-pay

## 2021-10-26 DIAGNOSIS — R1310 Dysphagia, unspecified: Secondary | ICD-10-CM | POA: Diagnosis not present

## 2021-10-26 NOTE — Telephone Encounter (Signed)
Patient notified of results. Keep scheduled appointment next week.  ?

## 2021-11-02 ENCOUNTER — Ambulatory Visit: Payer: BC Managed Care – PPO | Admitting: Surgery

## 2021-11-02 ENCOUNTER — Encounter: Payer: Self-pay | Admitting: Surgery

## 2021-11-02 ENCOUNTER — Ambulatory Visit (INDEPENDENT_AMBULATORY_CARE_PROVIDER_SITE_OTHER): Payer: BC Managed Care – PPO | Admitting: Surgery

## 2021-11-02 VITALS — BP 109/74 | HR 80 | Temp 98.2°F | Wt 187.2 lb

## 2021-11-02 DIAGNOSIS — K449 Diaphragmatic hernia without obstruction or gangrene: Secondary | ICD-10-CM | POA: Diagnosis not present

## 2021-11-02 DIAGNOSIS — R768 Other specified abnormal immunological findings in serum: Secondary | ICD-10-CM | POA: Diagnosis not present

## 2021-11-02 NOTE — Patient Instructions (Signed)
Our surgery scheduler Pamala Hurry will call you within 24-48 hours to get you scheduled. If you have not heard from her after 48 hours, please call our office. Have the blue sheet available when she calls to write down important information. ? ? ?A referral has been placed to Infectious Disease. They will call you for an appointment.  ? ?If you have any concerns or questions, please feel free to call our office.  ? ? ?Hiatal Hernia ? ? ?A hiatal hernia occurs when part of the stomach slides above the muscle that separates the abdomen from the chest (diaphragm). A person can be born with a hiatal hernia (congenital), or it may develop over time. In almost all cases of hiatal hernia, only the top part of the stomach pushes through the diaphragm. ?Many people have a hiatal hernia with no symptoms. The larger the hernia, the more likely it is that you will have symptoms. In some cases, a hiatal hernia allows stomach acid to flow back into the tube that carries food from your mouth to your stomach (esophagus). This may cause heartburn symptoms. Severe heartburn symptoms may mean that you have developed a condition called gastroesophageal reflux disease (GERD). ?What are the causes? ?This condition is caused by a weakness in the opening (hiatus) where the esophagus passes through the diaphragm to attach to the upper part of the stomach. A person may be born with a weakness in the hiatus, or a weakness can develop over time. ?What increases the risk? ?This condition is more likely to develop in: ?Older people. Age is a major risk factor for a hiatal hernia, especially if you are over the age of 22. ?Pregnant women. ?People who are overweight. ?People who have frequent constipation. ?What are the signs or symptoms? ?Symptoms of this condition usually develop in the form of GERD symptoms. Symptoms include: ?Heartburn. ?Belching. ?Indigestion. ?Trouble swallowing. ?Coughing or wheezing. ?Sore throat. ?Hoarseness. ?Chest  pain. ?Nausea and vomiting. ?How is this diagnosed? ?This condition may be diagnosed during testing for GERD. Tests that may be done include: ?X-rays of your stomach or chest. ?An upper gastrointestinal (GI) series. This is an X-ray exam of your GI tract that is taken after you swallow a chalky liquid that shows up clearly on the X-ray. ?Endoscopy. This is a procedure to look into your stomach using a thin, flexible tube that has a tiny camera and light on the end of it. ?How is this treated? ?This condition may be treated by: ?Dietary and lifestyle changes to help reduce GERD symptoms. ?Medicines. These may include: ?Over-the-counter antacids. ?Medicines that make your stomach empty more quickly. ?Medicines that block the production of stomach acid (H2 blockers). ?Stronger medicines to reduce stomach acid (proton pump inhibitors). ?Surgery to repair the hernia, if other treatments are not helping. ?If you have no symptoms, you may not need treatment. ?Follow these instructions at home: ?Lifestyle and activity ?Do not use any products that contain nicotine or tobacco, such as cigarettes and e-cigarettes. If you need help quitting, ask your health care provider. ?Try to achieve and maintain a healthy body weight. ?Avoid putting pressure on your abdomen. Anything that puts pressure on your abdomen increases the amount of acid that may be pushed up into your esophagus. ?Avoid bending over, especially after eating. ?Raise the head of your bed by putting blocks under the legs. This keeps your head and esophagus higher than your stomach. ?Do not wear tight clothing around your chest or stomach. ?Try not to strain  when having a bowel movement, when urinating, or when lifting heavy objects. ?Eating and drinking ?Avoid foods that can worsen GERD symptoms. These may include: ?Fatty foods, like fried foods. ?Citrus fruits, like oranges or lemon. ?Other foods and drinks that contain acid, like orange juice or tomatoes. ?Spicy  food. ?Chocolate. ?Eat frequent small meals instead of three large meals a day. This helps prevent your stomach from getting too full. ?Eat slowly. ?Do not lie down right after eating. ?Do not eat 1-2 hours before bed. ?Do not drink beverages with caffeine. These include cola, coffee, cocoa, and tea. ?Do not drink alcohol. ?General instructions ?Take over-the-counter and prescription medicines only as told by your health care provider. ?Keep all follow-up visits as told by your health care provider. This is important. ?Contact a health care provider if: ?Your symptoms are not controlled with medicines or lifestyle changes. ?You are having trouble swallowing. ?You have coughing or wheezing that will not go away. ?Get help right away if: ?Your pain is getting worse. ?Your pain spreads to your arms, neck, jaw, teeth, or back. ?You have shortness of breath. ?You sweat for no reason. ?You feel sick to your stomach (nauseous) or you vomit. ?You vomit blood. ?You have bright red blood in your stools. ?You have black, tarry stools. ?Summary ?A hiatal hernia occurs when part of the stomach slides above the muscle that separates the abdomen from the chest (diaphragm). ?A person may be born with a weakness in the hiatus, or a weakness can develop over time. ?Symptoms of hiatal hernia may include heartburn, trouble swallowing, or sore throat. ?Management of hiatal hernia includes eating frequent small meals instead of three large meals a day. ?Get help right away if you vomit blood, have bright red blood in your stools, or have black, tarry stools. ?This information is not intended to replace advice given to you by your health care provider. Make sure you discuss any questions you have with your health care provider. ?Document Revised: 04/28/2021 Document Reviewed: 05/15/2020 ?Elsevier Patient Education ? [place]. ? ? ? ? ?

## 2021-11-03 ENCOUNTER — Encounter: Payer: Self-pay | Admitting: Infectious Diseases

## 2021-11-03 ENCOUNTER — Telehealth: Payer: Self-pay | Admitting: Surgery

## 2021-11-03 ENCOUNTER — Ambulatory Visit: Payer: BC Managed Care – PPO | Attending: Infectious Diseases | Admitting: Infectious Diseases

## 2021-11-03 VITALS — BP 101/9 | HR 75 | Temp 97.9°F | Ht 72.0 in

## 2021-11-03 DIAGNOSIS — R768 Other specified abnormal immunological findings in serum: Secondary | ICD-10-CM

## 2021-11-03 DIAGNOSIS — B1920 Unspecified viral hepatitis C without hepatic coma: Secondary | ICD-10-CM | POA: Insufficient documentation

## 2021-11-03 DIAGNOSIS — F419 Anxiety disorder, unspecified: Secondary | ICD-10-CM | POA: Insufficient documentation

## 2021-11-03 DIAGNOSIS — I10 Essential (primary) hypertension: Secondary | ICD-10-CM | POA: Insufficient documentation

## 2021-11-03 DIAGNOSIS — K449 Diaphragmatic hernia without obstruction or gangrene: Secondary | ICD-10-CM | POA: Insufficient documentation

## 2021-11-03 DIAGNOSIS — F32A Depression, unspecified: Secondary | ICD-10-CM | POA: Insufficient documentation

## 2021-11-03 DIAGNOSIS — Z79899 Other long term (current) drug therapy: Secondary | ICD-10-CM | POA: Insufficient documentation

## 2021-11-03 DIAGNOSIS — E669 Obesity, unspecified: Secondary | ICD-10-CM | POA: Insufficient documentation

## 2021-11-03 NOTE — Patient Instructions (Signed)
You are here for HEPC antibody being positive- your HEPC RNA is negative- so you dont have an active infection- you dont need any treatment-  ?

## 2021-11-03 NOTE — Telephone Encounter (Signed)
Outgoing call is made, not able to leave message, voice mailbox is full.  Please inform patient of the following regarding scheduled surgery:  ? ?Pre-Admission date/time, COVID Testing date and Surgery date. ? ?Surgery Date: 11/17/21 ?Preadmission Testing Date: 11/10/21 (phone 8a-1p) ?Covid Testing Date: Not needed.    ? ?Also patient will need to call at [phone], between 1-3:00pm the day before surgery, to find out what time to arrive for surgery.   ? ?

## 2021-11-03 NOTE — Progress Notes (Signed)
NAME: Nathan Moody  ?DOB: 08-09-1970  ?MRN: [id number]  ?Date/Time: 11/03/2021 10:13 AM ? ? ?Subjective:  ?Patient is referred to me by his PCP for positive hepatitis C antibody. ?? ?Nathan Moody is a 51 y.o. male with a history of ? ? ?ID  ?With a history of anxiety, depression, hypertension, paraesophageal hernia for which he has undergone 2 surgeries.  The last 2 surgery was done at [facility] by Dr. Dahlia Byes on 06/09/21 ?The first time it was on 10/21/2020 at [facility] and the second surgery was on 06/09/2021.Marland Kitchen  He was doing well after the second surgery for a few weeks and then developed some discomfort in swallowing.  So he is scheduled to have another surgery later this month. ?As part of the work-up he had labs done and hep C antibody was positive on 10/29/2021. ?RNA has been sent at the same time and that has been negative he is here to discuss the test results further. ?Patient does not have any specific risk for hepatitis C. ?No history of IV drug use, no blood transfusion or surgeries before 80s .  Monogamous relationship.  His wife is going to be testing soon ?Has a 58-year-old son ?Patient has intentional weight loss of 40 pounds after he started using Wegovy.   ?Past Medical History:  ?Diagnosis Date  ? Anxiety   ? Depression   ? Hypertension   ? Paraesophageal hernia   ? Umbilical hernia   ?  ?Past Surgical History:  ?Procedure Laterality Date  ? INSERTION OF MESH  06/09/2021  ? Procedure: INSERTION OF MESH;  Surgeon: Jules Husbands, MD;  Location: [facility] ORS;  Service: General;;  ? UMBILICAL HERNIA REPAIR  10/21/2020  ? and epigastric hernia repair  ? VENTRAL HERNIA REPAIR N/A 06/09/2021  ? Procedure: HERNIA REPAIR VENTRAL ADULT, incisional;  Surgeon: Jules Husbands, MD;  Location: [facility] ORS;  Service: General;  Laterality: N/A;  ? XI ROBOTIC ASSISTED HIATAL HERNIA REPAIR N/A 06/09/2021  ? Procedure: XI ROBOTIC ASSISTED HIATAL HERNIA REPAIR, RNFA to assist;  Surgeon: Jules Husbands, MD;  Location: [facility] ORS;  Service:  General;  Laterality: N/A;  ?  ?Social History  ? ?Socioeconomic History  ? Marital status: Married  ?  Spouse name: Dorian Pod  ? Number of children: 1  ? Years of education: Not on file  ? Highest education level: Not on file  ?Occupational History  ? Not on file  ?Tobacco Use  ? Smoking status: Never  ?  Passive exposure: Never  ? Smokeless tobacco: Never  ?Vaping Use  ? Vaping Use: Never used  ?Substance and Sexual Activity  ? Alcohol use: Yes  ?  Comment: occassional  ? Drug use: Never  ? Sexual activity: Yes  ?Other Topics Concern  ? Not on file  ?Social History Narrative  ? Not on file  ? ?Social Determinants of Health  ? ?Financial Resource Strain: Not on file  ?Food Insecurity: Not on file  ?Transportation Needs: Not on file  ?Physical Activity: Not on file  ?Stress: Not on file  ?Social Connections: Not on file  ?Intimate Partner Violence: Not on file  ?  ?No family history on file. ?Allergies  ?Allergen Reactions  ? Chlorhexidine Gluconate   ?  rash  ? Pregabalin Rash  ? ?I? ?Current Outpatient Medications  ?Medication Sig Dispense Refill  ? buPROPion (WELLBUTRIN XL) 150 MG 24 hr tablet Take 150 mg by mouth daily.    ? busPIRone (BUSPAR) 10 MG tablet Take 10  mg by mouth daily in the afternoon.    ? lamoTRIgine (LAMICTAL) 25 MG tablet Take 2 tablets by mouth at bedtime.    ? traZODone (DESYREL) 100 MG tablet Take 100 mg by mouth at bedtime.    ? valACYclovir (VALTREX) 500 MG tablet Take 1,000 mg by mouth at bedtime.    ? WEGOVY 0.5 MG/0.5ML SOAJ Inject 0.5 mg into the skin once a week.    ? ?No current facility-administered medications for this visit.  ?  ? ?Abtx:  ?Anti-infectives (From admission, onward)  ? ? None  ? ?  ? ? ?REVIEW OF SYSTEMS:  ?Const: negative fever, negative chills, positive weight loss using Wegovy ?Eyes: negative diplopia or visual changes, negative eye pain ?ENT: negative coryza, negative sore throat ?Resp: negative cough, hemoptysis, dyspnea ?Cards: negative for chest pain,  palpitations, lower extremity edema ?GU: negative for frequency, dysuria and hematuria ?GI: Has some dysphagia. ?Skin: negative for rash and pruritus ?Heme: negative for easy bruising and gum/nose bleeding ?MS: negative for myalgias, arthralgias, back pain and muscle weakness ?Neurolo:negative for headaches, dizziness, vertigo, memory problems  ?Psych: History of anxiety depression.  Is on medical leave from work ?Endocrine: negative for thyroid, diabetes ?Allergy/Immunology-as above ?Objective:  ?VITALS:  ?BP (!) 101/9   Pulse 75   Temp 97.9 ?F (36.6 ?C) (Temporal)   Ht 6' (1.829 m)   BMI 25.39 kg/m?  ? ?PHYSICAL EXAM:  ?General: Alert, cooperative, no distress, appears stated age.  ?Head: Normocephalic, without obvious abnormality, atraumatic. ?Eyes: Conjunctivae clear, anicteric sclerae. Pupils are equal ?ENT Nares normal. No drainage or sinus tenderness. ?Lips, mucosa, and tongue normal. No Thrush ?Neck: Supple, symmetrical, no adenopathy, thyroid: non tender ?no carotid bruit and no JVD. ?Back: No CVA tenderness. ?Lungs: Clear to auscultation bilaterally. No Wheezing or Rhonchi. No rales. ?Heart: Regular rate and rhythm, no murmur, rub or gallop. ?Abdomen: Soft, lap scars.  No organomegaly ?Extremities: atraumatic, no cyanosis. No edema. No clubbing ?Skin: No rashes or lesions. Or bruising ?Lymph: Cervical, supraclavicular normal. ?Neurologic: Grossly non-focal ?Pertinent Labs ?Lab Results ?CBC ?   ?Component Value Date/Time  ? WBC 7.5 06/22/2021 0823  ? RBC 4.79 06/22/2021 0823  ? HGB 15.4 06/22/2021 0823  ? HCT 44.5 06/22/2021 0823  ? PLT 291 06/22/2021 0823  ? MCV 92.9 06/22/2021 0823  ? MCH 32.2 06/22/2021 0823  ? MCHC 34.6 06/22/2021 0823  ? RDW 12.1 06/22/2021 0823  ? LYMPHSABS 2.2 06/22/2021 0823  ? MONOABS 0.6 06/22/2021 0823  ? EOSABS 0.7 (H) 06/22/2021 7680  ? BASOSABS 0.1 06/22/2021 8811  ? ? ? ?  Latest Ref Rng & Units 06/22/2021  ?  8:23 AM 06/20/2021  ?  2:33 PM 06/10/2021  ?  4:35 AM  ?CMP   ?Glucose 70 - 99 mg/dL 117   108   157    ?BUN 6 - 20 mg/dL 10   11   18     ?Creatinine 0.61 - 1.24 mg/dL 1.31   1.15   0.98    ?Sodium 135 - 145 mmol/L 137   138   135    ?Potassium 3.5 - 5.1 mmol/L 3.7   4.1   4.2    ?Chloride 98 - 111 mmol/L 108   103   107    ?CO2 22 - 32 mmol/L 26   27   24     ?Calcium 8.9 - 10.3 mg/dL 8.4   9.2   8.0    ?Total Protein 6.5 - 8.1 g/dL 7.5  8.1     ?Total Bilirubin 0.3 - 1.2 mg/dL 0.6   0.8     ?Alkaline Phos 38 - 126 U/L 60   59     ?AST 15 - 41 U/L 21   19     ?ALT 0 - 44 U/L 18   16     ? ?10/29/2021 ?Hep C antibody positive ?Hep C RNA negative ?06/09/2021 HIV negative ?06/22/2021 creatinine 1.31 ?ALT AST normal ? ?? 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  He does not have any active infection.  He had eliminated the infection without any treatment. ?No need to for any further intervention ?Would recommend checking his hepatitis B surface antigen, surface antibody and core antibody and if needed to vaccinate him. ? ?Obesity.  Reduced weight of 40 pounds by taking Wegovy. ? ?Paraesophageal hernia awaiting second surgery the end of the month.   ? ?Discussed the management with the patient in detail.  No follow-up needed. ?_________________________________________ ?Note:  This document was prepared using Dragon voice recognition software and may include unintentional dictation errors.  ?

## 2021-11-04 ENCOUNTER — Ambulatory Visit: Payer: BC Managed Care – PPO | Admitting: Surgery

## 2021-11-04 NOTE — Progress Notes (Signed)
Outpatient Surgical Follow Up ? ?11/04/2021 ? ?Elzie Monrreal is an 51 y.o. male.  ? ?Chief Complaint  ?Patient presents with  ? Follow-up  ?  Hiatal hernia  ? ? ?HPI: Triplet is a 51 year old male well-known to me with a history of robotic paraesophageal hernia repair 5 months ago ?Marland Kitchen  He started having symptoms again of reflux cough and some swallowing issues.  I repeated a work-up to include barium swallow and CT scan of the abdomen pelvis I have personally reviewed the images showing recurrence of the paraesophageal hernia.  There is intact wrap.  There is no evidence of complicating features.  There is no other acute intra-abdominal abnormalities.  He did have hepatitis C panel by primary care. ?His antibody was positive.  He wishes to talk to an expert about it.  We will arrange ID consultation ? ? ? ?Past Medical History:  ?Diagnosis Date  ? Anxiety   ? Depression   ? Hypertension   ? Paraesophageal hernia   ? Umbilical hernia   ? ? ?Past Surgical History:  ?Procedure Laterality Date  ? INSERTION OF MESH  06/09/2021  ? Procedure: INSERTION OF MESH;  Surgeon: Jules Husbands, MD;  Location: [facility] ORS;  Service: General;;  ? UMBILICAL HERNIA REPAIR  10/21/2020  ? and epigastric hernia repair  ? VENTRAL HERNIA REPAIR N/A 06/09/2021  ? Procedure: HERNIA REPAIR VENTRAL ADULT, incisional;  Surgeon: Jules Husbands, MD;  Location: [facility] ORS;  Service: General;  Laterality: N/A;  ? XI ROBOTIC ASSISTED HIATAL HERNIA REPAIR N/A 06/09/2021  ? Procedure: XI ROBOTIC ASSISTED HIATAL HERNIA REPAIR, RNFA to assist;  Surgeon: Jules Husbands, MD;  Location: [facility] ORS;  Service: General;  Laterality: N/A;  ? ? ?History reviewed. No pertinent family history. ? ?Social History:  reports that he has never smoked. He has never been exposed to tobacco smoke. He has never used smokeless tobacco. He reports current alcohol use. He reports that he does not use drugs. ? ?Allergies:  ?Allergies  ?Allergen Reactions  ? Chlorhexidine Gluconate    ?  rash  ? Pregabalin Rash  ? ? ?Medications reviewed. ? ? ? ?ROS ?Full ROS performed and is otherwise negative other than what is stated in HPI ? ? ?BP 109/74   Pulse 80   Temp 98.2 ?F (36.8 ?C) (Oral)   Wt 187 lb 3.2 oz (84.9 kg)   SpO2 98%   BMI 25.39 kg/m?  ? ?Physical Exam ?Vitals and nursing note reviewed. Exam conducted with a chaperone present.  ?Constitutional:   ?   General: He is not in acute distress. ?   Appearance: Normal appearance.  ?Cardiovascular:  ?   Rate and Rhythm: Normal rate.  ?   Heart sounds: No murmur heard. ?Pulmonary:  ?   Effort: Pulmonary effort is normal. No respiratory distress.  ?   Breath sounds: Normal breath sounds. No stridor.  ?Abdominal:  ?   General: Abdomen is flat. There is no distension.  ?   Palpations: Abdomen is soft. There is no mass.  ?   Tenderness: There is no abdominal tenderness. There is no guarding or rebound.  ?   Hernia: No hernia is present.  ?Musculoskeletal:     ?   General: Normal range of motion.  ?   Cervical back: Normal range of motion and neck supple. No rigidity or tenderness.  ?Skin: ?   General: Skin is warm and dry.  ?   Capillary Refill: Capillary refill takes  less than 2 seconds.  ?Neurological:  ?   General: No focal deficit present.  ?   Mental Status: He is alert and oriented to person, place, and time.  ?Psychiatric:     ?   Mood and Affect: Mood normal.     ?   Behavior: Behavior normal.     ?   Thought Content: Thought content normal.     ?   Judgment: Judgment normal.  ? ? ? 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  Discussed with the patient in detail and I do think that we have to go back on repair to hiatal hernia given that he is a significant defect causing symptoms.  We will do robotic approach.  Procedure discussed with patient detail.  Risks, benefits and possible implications including but not limited to infection, intra-abdominal injuries, recurrence.  He understands and  wished to proceed.  We will also arrange ID to see for his hepatitis C antibody.  I spent 30 minutes this encounter including coordination of his care, placing orders and performing appropriate counseling ? ?Caroleen Hamman, MD FACS ?General Surgeon   ?

## 2021-11-04 NOTE — Telephone Encounter (Signed)
Patient calls back, he is now aware of all dates regarding his surgery and verbalized understanding. ?

## 2021-11-04 NOTE — H&P (View-Only) (Signed)
Outpatient Surgical Follow Up  11/04/2021  Nathan Moody is an 51 y.o. male.   Chief Complaint  Patient presents with   Follow-up    Hiatal hernia    HPI: Triplet is a 51 year old male well-known to me with a history of robotic paraesophageal hernia repair 5 months ago .  He started having symptoms again of reflux cough and some swallowing issues.  I repeated a work-up to include barium swallow and CT scan of the abdomen pelvis I have personally reviewed the images showing recurrence of the paraesophageal hernia.  There is intact wrap.  There is no evidence of complicating features.  There is no other acute intra-abdominal abnormalities.  He did have hepatitis C panel by primary care. His antibody was positive.  He wishes to talk to an expert about it.  We will arrange ID consultation    Past Medical History:  Diagnosis Date   Anxiety    Depression    Hypertension    Paraesophageal hernia    Umbilical hernia     Past Surgical History:  Procedure Laterality Date   INSERTION OF MESH  06/09/2021   Procedure: INSERTION OF MESH;  Surgeon: Jules Husbands, MD;  Location: [facility] ORS;  Service: General;;   UMBILICAL HERNIA REPAIR  10/21/2020   and epigastric hernia repair   VENTRAL HERNIA REPAIR N/A 06/09/2021   Procedure: HERNIA REPAIR VENTRAL ADULT, incisional;  Surgeon: Jules Husbands, MD;  Location: [facility] ORS;  Service: General;  Laterality: N/A;   XI ROBOTIC ASSISTED HIATAL HERNIA REPAIR N/A 06/09/2021   Procedure: XI ROBOTIC ASSISTED HIATAL HERNIA REPAIR, RNFA to assist;  Surgeon: Jules Husbands, MD;  Location: [facility] ORS;  Service: General;  Laterality: N/A;    History reviewed. No pertinent family history.  Social History:  reports that he has never smoked. He has never been exposed to tobacco smoke. He has never used smokeless tobacco. He reports current alcohol use. He reports that he does not use drugs.  Allergies:  Allergies  Allergen Reactions   Chlorhexidine Gluconate      rash   Pregabalin Rash    Medications reviewed.    ROS Full ROS performed and is otherwise negative other than what is stated in HPI   BP 109/74   Pulse 80   Temp 98.2 F (36.8 C) (Oral)   Wt 187 lb 3.2 oz (84.9 kg)   SpO2 98%   BMI 25.39 kg/m   Physical Exam Vitals and nursing note reviewed. Exam conducted with a chaperone present.  Constitutional:      General: He is not in acute distress.    Appearance: Normal appearance.  Cardiovascular:     Rate and Rhythm: Normal rate.     Heart sounds: No murmur heard. Pulmonary:     Effort: Pulmonary effort is normal. No respiratory distress.     Breath sounds: Normal breath sounds. No stridor.  Abdominal:     General: Abdomen is flat. There is no distension.     Palpations: Abdomen is soft. There is no mass.     Tenderness: There is no abdominal tenderness. There is no guarding or rebound.     Hernia: No hernia is present.  Musculoskeletal:        General: Normal range of motion.     Cervical back: Normal range of motion and neck supple. No rigidity or tenderness.  Skin:    General: Skin is warm and dry.     Capillary Refill: Capillary refill takes  less than 2 seconds.  Neurological:     General: No focal deficit present.     Mental Status: He is alert and oriented to person, place, and time.  Psychiatric:        Mood and Affect: Mood normal.        Behavior: Behavior normal.        Thought Content: Thought content normal.        Judgment: Judgment normal.     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  Discussed with the patient in detail and I do think that we have to go back on repair to hiatal hernia given that he is a significant defect causing symptoms.  We will do robotic approach.  Procedure discussed with patient detail.  Risks, benefits and possible implications including but not limited to infection, intra-abdominal injuries, recurrence.  He understands and  wished to proceed.  We will also arrange ID to see for his hepatitis C antibody.  I spent 30 minutes this encounter including coordination of his care, placing orders and performing appropriate counseling  Caroleen Hamman, MD [place] Surgeon

## 2021-11-04 NOTE — Telephone Encounter (Signed)
Another message is left for patient to call so that we can provide the information below regarding his surgery.  ?

## 2021-11-10 ENCOUNTER — Encounter: Payer: Self-pay | Admitting: Urgent Care

## 2021-11-10 ENCOUNTER — Other Ambulatory Visit: Payer: Self-pay

## 2021-11-10 ENCOUNTER — Other Ambulatory Visit
Admission: RE | Admit: 2021-11-10 | Discharge: 2021-11-10 | Disposition: A | Discharge status: Home / Self Care | Payer: BC Managed Care – PPO | Source: Ambulatory Visit | Attending: Surgery | Admitting: Surgery

## 2021-11-10 DIAGNOSIS — Z01810 Encounter for preprocedural cardiovascular examination: Secondary | ICD-10-CM | POA: Diagnosis not present

## 2021-11-10 DIAGNOSIS — I10 Essential (primary) hypertension: Secondary | ICD-10-CM

## 2021-11-10 DIAGNOSIS — Z01812 Encounter for preprocedural laboratory examination: Secondary | ICD-10-CM

## 2021-11-10 DIAGNOSIS — Z01818 Encounter for other preprocedural examination: Secondary | ICD-10-CM | POA: Diagnosis present

## 2021-11-10 HISTORY — DX: Inflammatory liver disease, unspecified: K75.9

## 2021-11-10 HISTORY — DX: Gastro-esophageal reflux disease without esophagitis: K21.9

## 2021-11-10 LAB — BASIC METABOLIC PANEL
Anion gap: 8 (ref 5–15)
BUN: 12 mg/dL (ref 6–20)
CO2: 29 mmol/L (ref 22–32)
Calcium: 9.2 mg/dL (ref 8.9–10.3)
Chloride: 102 mmol/L (ref 98–111)
Creatinine, Ser: 1.05 mg/dL (ref 0.61–1.24)
GFR, Estimated: >60 mL/min (ref >60)
Glucose, Bld: 81 mg/dL (ref 70–99)
Potassium: 3.6 mmol/L (ref 3.5–5.1)
Sodium: 139 mmol/L (ref 135–145)

## 2021-11-10 LAB — CBC
HCT: 48.4 % (ref 39.0–52.0)
Hemoglobin: 16.6 g/dL (ref 13.0–17.0)
MCH: 31 pg (ref 26.0–34.0)
MCHC: 34.3 g/dL (ref 30.0–36.0)
MCV: 90.5 fL (ref 80.0–100.0)
Platelets: 190 10*3/uL (ref 150–400)
RBC: 5.35 MIL/uL (ref 4.22–5.81)
RDW: 13.1 % (ref 11.5–15.5)
WBC: 3.7 10*3/uL — ABNORMAL LOW (ref 4.0–10.5)
nRBC: 0 % (ref 0.0–0.2)

## 2021-11-10 NOTE — Patient Instructions (Addendum)
Your procedure is scheduled on: 11/17/21 - Tuesday ?Report to the Registration Desk on the 1st floor of the [facility]. ?To find out your arrival time, please call [phone] between 1PM - 3PM on: 11/16/21 - Monday ?If your arrival time is 6:00 am, do not arrive prior to that time as the [place] [facility] entrance doors do not open until 6:00 am. ? ?REMEMBER: ?Instructions that are not followed completely may result in serious medical risk, up to and including death; or upon the discretion of your surgeon and anesthesiologist your surgery may need to be rescheduled. ? ?Do not eat food after midnight the night before surgery.  ?No gum chewing, lozengers or hard candies. ? ?You may however, drink CLEAR liquids up to 2 hours before you are scheduled to arrive for your surgery. Do not drink anything within 2 hours of your scheduled arrival time. ? ?Clear liquids include: ?- water  ?- apple juice without pulp ?- gatorade (not RED colors) ?- black coffee or tea (Do NOT add milk or creamers to the coffee or tea) ?Do NOT drink anything that is not on this list. ? ?TAKE THESE MEDICATIONS THE MORNING OF SURGERY WITH A SIP OF WATER: ? ?- buPROPion (WELLBUTRIN XL) 150 MG 24 hr tablet ?- valACYclovir (VALTREX) 500 MG tablet ?- ALPRAZolam (XANAX) 0.25 MG tablet if needed ?- propranolol (INDERAL) 20 MG tablet ?- Prilosec OTC 20 mg  ? ?One week prior to surgery: ?Stop Anti-inflammatories (NSAIDS) such as Advil, Aleve, Ibuprofen, Motrin, Naproxen, Naprosyn and Aspirin based products such as Excedrin, Goodys Powder, BC Powder. ? ?Stop ANY OVER THE COUNTER supplements until after surgery. ? ?You may take Tylenol if needed for pain up until the day of surgery. ? ?No Alcohol for 24 hours before or after surgery. ? ?No Smoking including e-cigarettes for 24 hours prior to surgery.  ?No chewable tobacco products for at least 6 hours prior to surgery.  ?No nicotine patches on the day of surgery. ? ?Do not use any "recreational" drugs for  at least a week prior to your surgery.  ?Please be advised that the combination of cocaine and anesthesia may have negative outcomes, up to and including death. ?If you test positive for cocaine, your surgery will be cancelled. ? ?On the morning of surgery brush your teeth with toothpaste and water, you may rinse your mouth with mouthwash if you wish. ?Do not swallow any toothpaste or mouthwash. ? ?Do not wear jewelry, make-up, hairpins, clips or nail polish. ? ?Do not wear lotions, powders, or perfumes.  ? ?Do not shave body from the neck down 48 hours prior to surgery just in case you cut yourself which could leave a site for infection.  ?Also, freshly shaved skin may become irritated if using the CHG soap. ? ?Contact lenses, hearing aids and dentures may not be worn into surgery. ? ?Do not bring valuables to the hospital. [facility] is not responsible for any missing/lost belongings or valuables.  ? ?Notify your doctor if there is any change in your medical condition (cold, fever, infection). ? ?Wear comfortable clothing (specific to your surgery type) to the hospital. ? ?After surgery, you can help prevent lung complications by doing breathing exercises.  ?Take deep breaths and cough every 1-2 hours. Your doctor may order a device called an Incentive Spirometer to help you take deep breaths. ?When coughing or sneezing, hold a pillow firmly against your incision with both hands. This is called ?splinting.? Doing this helps protect your incision.  It also decreases belly discomfort. ? ?If you are being admitted to the hospital overnight, leave your suitcase in the car. ?After surgery it may be brought to your room. ? ?If you are being discharged the day of surgery, you will not be allowed to drive home. ?You will need a responsible adult (18 years or older) to drive you home and stay with you that night.  ? ?If you are taking public transportation, you will need to have a responsible adult (18 years or older) with  you. ?Please confirm with your physician that it is acceptable to use public transportation.  ? ?Please call the [facility] at [phone] if you have any questions about these instructions. ? ?Surgery Visitation Policy: ? ?Patients undergoing a surgery or procedure may have two family members or support persons with them as long as the person is not COVID-19 positive or experiencing its symptoms.  ? ?Inpatient Visitation:   ? ?Visiting hours are 7 a.m. to 8 p.m. ?Up to four visitors are allowed at one time in a patient room, including children. The visitors may rotate out with other people during the day. One designated support person (adult) may remain overnight.  ?

## 2021-11-16 MED ORDER — CELECOXIB 200 MG PO CAPS: 200.0000 mg | ORAL_CAPSULE | ORAL | Status: AC

## 2021-11-16 MED ORDER — CEFAZOLIN SODIUM-DEXTROSE 2-4 GM/100ML-% IV SOLN
2.0000 g | INTRAVENOUS | Status: AC
  Administered 2021-11-17: 2 g via INTRAVENOUS

## 2021-11-16 MED ORDER — ACETAMINOPHEN 500 MG PO TABS: 1000.0000 mg | ORAL_TABLET | ORAL | Status: AC

## 2021-11-16 MED ORDER — GABAPENTIN 300 MG PO CAPS: 300.0000 mg | ORAL_CAPSULE | ORAL | Status: AC

## 2021-11-16 MED ORDER — ORAL CARE MOUTH RINSE: 15.0000 mL | Freq: Once | OROMUCOSAL | Status: DC

## 2021-11-16 MED ORDER — LACTATED RINGERS IV SOLN: INTRAVENOUS | Status: DC

## 2021-11-16 NOTE — Anesthesia Preprocedure Evaluation (Addendum)
Anesthesia Evaluation  Patient identified by MRN, date of birth, ID band Patient awake    Reviewed: Allergy & Precautions, NPO status , Patient's Chart, lab work & pertinent test results  History of Anesthesia Complications Negative for: history of anesthetic complications  Airway Mallampati: II  TM Distance: >3 FB Neck ROM: full    Dental no notable dental hx.    Pulmonary shortness of breath and with exertion,    Pulmonary exam normal        Cardiovascular Exercise Tolerance: Good hypertension, (-) angina+ DOE  (-) Past MI Normal cardiovascular exam     Neuro/Psych PSYCHIATRIC DISORDERS Anxiety Depression negative neurological ROS     GI/Hepatic Neg liver ROS, hiatal hernia, GERD  Medicated,S/p HH repair 05/2021   Endo/Other  negative endocrine ROS  Renal/GU negative Renal ROS     Musculoskeletal   Abdominal Normal abdominal exam  (+)   Peds  Hematology negative hematology ROS (+)   Anesthesia Other Findings Past Medical History: No date: Anxiety No date: Depression No date: Hypertension No date: Paraesophageal hernia No date: Umbilical hernia  Past Surgical History: 97/74/1423: UMBILICAL HERNIA REPAIR     Comment:  and epigastric hernia repair  BMI    Body Mass Index: 30.34 kg/m      Reproductive/Obstetrics negative OB ROS                            Anesthesia Physical  Anesthesia Plan  ASA: 2  Anesthesia Plan: General ETT   Post-op Pain Management: Tylenol PO (pre-op)*, Celebrex PO (pre-op)* and Gabapentin PO (pre-op)*   Induction: Intravenous and Rapid sequence  PONV Risk Score and Plan: 3 and Ondansetron, Dexamethasone, Midazolam and Treatment may vary due to age or medical condition  Airway Management Planned: Oral ETT  Additional Equipment:   Intra-op Plan:   Post-operative Plan: Extubation in OR  Informed Consent: I have reviewed the patients History  and Physical, chart, labs and discussed the procedure including the risks, benefits and alternatives for the proposed anesthesia with the patient or authorized representative who has indicated his/her understanding and acceptance.     Dental Advisory Given  Plan Discussed with: Anesthesiologist, CRNA and Surgeon  Anesthesia Plan Comments: (Patient consented for risks of anesthesia including but not limited to:  - adverse reactions to medications - damage to eyes, teeth, lips or other oral mucosa - nerve damage due to positioning  - sore throat or hoarseness - Damage to heart, brain, nerves, lungs, other parts of body or loss of life  Patient voiced understanding.)      Anesthesia Quick Evaluation

## 2021-11-17 ENCOUNTER — Encounter
Admission: RE | Disposition: A | Discharge status: Home / Self Care | Payer: Self-pay | Source: Ambulatory Visit | Attending: Surgery

## 2021-11-17 ENCOUNTER — Ambulatory Visit: Payer: BC Managed Care – PPO | Admitting: Anesthesiology

## 2021-11-17 ENCOUNTER — Other Ambulatory Visit: Payer: Self-pay

## 2021-11-17 ENCOUNTER — Observation Stay
Admission: RE | Admit: 2021-11-17 | Discharge: 2021-11-18 | Disposition: A | Discharge status: Home / Self Care | Payer: BC Managed Care – PPO | Source: Ambulatory Visit | Attending: Surgery | Admitting: Surgery

## 2021-11-17 ENCOUNTER — Encounter: Payer: Self-pay | Admitting: Surgery

## 2021-11-17 DIAGNOSIS — K449 Diaphragmatic hernia without obstruction or gangrene: Principal | ICD-10-CM | POA: Insufficient documentation

## 2021-11-17 DIAGNOSIS — I10 Essential (primary) hypertension: Secondary | ICD-10-CM | POA: Insufficient documentation

## 2021-11-17 DIAGNOSIS — Z8719 Personal history of other diseases of the digestive system: Secondary | ICD-10-CM

## 2021-11-17 DIAGNOSIS — R768 Other specified abnormal immunological findings in serum: Secondary | ICD-10-CM

## 2021-11-17 HISTORY — PX: XI ROBOTIC ASSISTED PARAESOPHAGEAL HERNIA REPAIR: SHX6871

## 2021-11-17 LAB — CBC
HCT: 46.2 % (ref 39.0–52.0)
Hemoglobin: 16 g/dL (ref 13.0–17.0)
MCH: 31.6 pg (ref 26.0–34.0)
MCHC: 34.6 g/dL (ref 30.0–36.0)
MCV: 91.1 fL (ref 80.0–100.0)
Platelets: 162 10*3/uL (ref 150–400)
RBC: 5.07 MIL/uL (ref 4.22–5.81)
RDW: 12.9 % (ref 11.5–15.5)
WBC: 8.3 10*3/uL (ref 4.0–10.5)
nRBC: 0 % (ref 0.0–0.2)

## 2021-11-17 LAB — CREATININE, SERUM
Creatinine, Ser: 1.12 mg/dL (ref 0.61–1.24)
GFR, Estimated: >60 mL/min (ref >60)

## 2021-11-17 SURGERY — REPAIR, HERNIA, PARAESOPHAGEAL, ROBOT-ASSISTED
Anesthesia: General | Site: Abdomen

## 2021-11-17 MED ORDER — PHENYLEPHRINE HCL-NACL 20-0.9 MG/250ML-% IV SOLN
INTRAVENOUS | Status: AC
  Filled 2021-11-17: qty 250

## 2021-11-17 MED ORDER — ONDANSETRON 4 MG PO TBDP
4.0000 mg | ORAL_TABLET | Freq: Four times a day (QID) | ORAL | Status: DC | PRN
Start: 2021-11-17 — End: 2021-11-18

## 2021-11-17 MED ORDER — CHLORHEXIDINE GLUCONATE 0.12 % MT SOLN
OROMUCOSAL | Status: AC
  Administered 2021-11-17: 15 mL
  Filled 2021-11-17: qty 15

## 2021-11-17 MED ORDER — FENTANYL CITRATE (PF) 250 MCG/5ML IJ SOLN
INTRAMUSCULAR | Status: AC
  Filled 2021-11-17: qty 5

## 2021-11-17 MED ORDER — ONDANSETRON HCL 4 MG/2ML IJ SOLN: 4.0000 mg | Freq: Four times a day (QID) | INTRAMUSCULAR | Status: DC | PRN

## 2021-11-17 MED ORDER — ROCURONIUM BROMIDE 100 MG/10ML IV SOLN
INTRAVENOUS | Status: DC | PRN
  Administered 2021-11-17: 20 mg via INTRAVENOUS
  Administered 2021-11-17: 100 mg via INTRAVENOUS
  Administered 2021-11-17 (×2): 20 mg via INTRAVENOUS

## 2021-11-17 MED ORDER — ESMOLOL HCL 100 MG/10ML IV SOLN
INTRAVENOUS | Status: AC
  Filled 2021-11-17: qty 10

## 2021-11-17 MED ORDER — BUPIVACAINE-EPINEPHRINE 0.25% -1:200000 IJ SOLN
INTRAMUSCULAR | Status: DC | PRN
  Administered 2021-11-17: 50 mL via SURGICAL_CAVITY

## 2021-11-17 MED ORDER — MIDAZOLAM HCL 2 MG/2ML IJ SOLN
INTRAMUSCULAR | Status: DC | PRN
  Administered 2021-11-17: 2 mg via INTRAVENOUS

## 2021-11-17 MED ORDER — KETAMINE HCL 10 MG/ML IJ SOLN
INTRAMUSCULAR | Status: DC | PRN
  Administered 2021-11-17: 20 mg via INTRAVENOUS
  Administered 2021-11-17: 30 mg via INTRAVENOUS

## 2021-11-17 MED ORDER — VISTASEAL 10 ML SINGLE DOSE KIT
PACK | CUTANEOUS | Status: DC | PRN
  Administered 2021-11-17: 10 mL via TOPICAL

## 2021-11-17 MED ORDER — FENTANYL CITRATE (PF) 100 MCG/2ML IJ SOLN
INTRAMUSCULAR | Status: DC | PRN
  Administered 2021-11-17 (×2): 50 ug via INTRAVENOUS
  Administered 2021-11-17: 150 ug via INTRAVENOUS

## 2021-11-17 MED ORDER — BUPIVACAINE-EPINEPHRINE (PF) 0.25% -1:200000 IJ SOLN
INTRAMUSCULAR | Status: AC
  Filled 2021-11-17: qty 30

## 2021-11-17 MED ORDER — KETAMINE HCL 50 MG/5ML IJ SOSY
PREFILLED_SYRINGE | INTRAMUSCULAR | Status: AC
  Filled 2021-11-17: qty 5

## 2021-11-17 MED ORDER — TRAZODONE HCL 100 MG PO TABS
100.0000 mg | ORAL_TABLET | Freq: Every day | ORAL | Status: DC
  Administered 2021-11-17: 100 mg via ORAL
  Filled 2021-11-17: qty 1

## 2021-11-17 MED ORDER — ONDANSETRON HCL 4 MG/2ML IJ SOLN
INTRAMUSCULAR | Status: AC
  Filled 2021-11-17: qty 2

## 2021-11-17 MED ORDER — PROCHLORPERAZINE EDISYLATE 10 MG/2ML IJ SOLN: 5.0000 mg | Freq: Four times a day (QID) | INTRAMUSCULAR | Status: DC | PRN

## 2021-11-17 MED ORDER — LAMOTRIGINE 25 MG PO TABS
50.0000 mg | ORAL_TABLET | Freq: Every day | ORAL | Status: DC
  Administered 2021-11-17: 50 mg via ORAL
  Filled 2021-11-17: qty 2

## 2021-11-17 MED ORDER — ESMOLOL HCL 100 MG/10ML IV SOLN
INTRAVENOUS | Status: DC | PRN
  Administered 2021-11-17 (×2): 10 mg via INTRAVENOUS

## 2021-11-17 MED ORDER — LIDOCAINE HCL (PF) 2 % IJ SOLN
INTRAMUSCULAR | Status: AC
  Filled 2021-11-17: qty 5

## 2021-11-17 MED ORDER — CEFAZOLIN SODIUM-DEXTROSE 2-4 GM/100ML-% IV SOLN
2.0000 g | Freq: Three times a day (TID) | INTRAVENOUS | Status: AC
  Administered 2021-11-17 – 2021-11-18 (×2): 2 g via INTRAVENOUS
  Filled 2021-11-17 (×2): qty 100

## 2021-11-17 MED ORDER — CELECOXIB 200 MG PO CAPS
ORAL_CAPSULE | ORAL | Status: AC
  Administered 2021-11-17: 200 mg via ORAL
  Filled 2021-11-17: qty 1

## 2021-11-17 MED ORDER — ACETAMINOPHEN 500 MG PO TABS
1000.0000 mg | ORAL_TABLET | Freq: Four times a day (QID) | ORAL | Status: DC
  Administered 2021-11-17 – 2021-11-18 (×3): 1000 mg via ORAL
  Filled 2021-11-17 (×3): qty 2

## 2021-11-17 MED ORDER — PROPRANOLOL HCL 10 MG PO TABS: 10.0000 mg | ORAL_TABLET | Freq: Three times a day (TID) | ORAL | Status: DC | PRN

## 2021-11-17 MED ORDER — PHENYLEPHRINE 80 MCG/ML (10ML) SYRINGE FOR IV PUSH (FOR BLOOD PRESSURE SUPPORT)
PREFILLED_SYRINGE | INTRAVENOUS | Status: DC | PRN
Start: 2021-11-17 — End: 2021-11-17
  Administered 2021-11-17: 320 ug via INTRAVENOUS
  Administered 2021-11-17 (×3): 160 ug via INTRAVENOUS

## 2021-11-17 MED ORDER — PREGABALIN 50 MG PO CAPS
100.0000 mg | ORAL_CAPSULE | Freq: Three times a day (TID) | ORAL | Status: DC
  Administered 2021-11-17 – 2021-11-18 (×3): 100 mg via ORAL
  Filled 2021-11-17 (×3): qty 2

## 2021-11-17 MED ORDER — PROPOFOL 10 MG/ML IV BOLUS
INTRAVENOUS | Status: AC
  Filled 2021-11-17: qty 40

## 2021-11-17 MED ORDER — MELATONIN 5 MG PO TABS: 2.5000 mg | ORAL_TABLET | Freq: Every evening | ORAL | Status: DC | PRN

## 2021-11-17 MED ORDER — CEFAZOLIN SODIUM-DEXTROSE 2-4 GM/100ML-% IV SOLN
INTRAVENOUS | Status: AC
  Filled 2021-11-17: qty 100

## 2021-11-17 MED ORDER — DEXMEDETOMIDINE (PRECEDEX) IN NS 20 MCG/5ML (4 MCG/ML) IV SYRINGE
PREFILLED_SYRINGE | INTRAVENOUS | Status: DC | PRN
  Administered 2021-11-17: 12 ug via INTRAVENOUS
  Administered 2021-11-17 (×2): 8 ug via INTRAVENOUS

## 2021-11-17 MED ORDER — DEXAMETHASONE SODIUM PHOSPHATE 10 MG/ML IJ SOLN
INTRAMUSCULAR | Status: AC
  Filled 2021-11-17: qty 1

## 2021-11-17 MED ORDER — HYDROMORPHONE HCL 1 MG/ML IJ SOLN: 0.2500 mg | INTRAMUSCULAR | Status: DC | PRN

## 2021-11-17 MED ORDER — BUPIVACAINE LIPOSOME 1.3 % IJ SUSP
INTRAMUSCULAR | Status: AC
  Filled 2021-11-17: qty 20

## 2021-11-17 MED ORDER — ONDANSETRON HCL 4 MG/2ML IJ SOLN
INTRAMUSCULAR | Status: DC | PRN
  Administered 2021-11-17: 4 mg via INTRAVENOUS

## 2021-11-17 MED ORDER — ACETAMINOPHEN 10 MG/ML IV SOLN: 1000.0000 mg | Freq: Once | INTRAVENOUS | Status: DC | PRN

## 2021-11-17 MED ORDER — VISTASEAL 10 ML SINGLE DOSE KIT
PACK | CUTANEOUS | Status: AC
  Filled 2021-11-17: qty 10

## 2021-11-17 MED ORDER — ALPRAZOLAM 0.5 MG PO TABS: 0.2500 mg | ORAL_TABLET | ORAL | Status: DC | PRN

## 2021-11-17 MED ORDER — KETOROLAC TROMETHAMINE 30 MG/ML IJ SOLN
30.0000 mg | Freq: Four times a day (QID) | INTRAMUSCULAR | Status: DC
  Administered 2021-11-17 – 2021-11-18 (×4): 30 mg via INTRAVENOUS
  Filled 2021-11-17 (×3): qty 1

## 2021-11-17 MED ORDER — DIPHENHYDRAMINE HCL 25 MG PO CAPS: 25.0000 mg | ORAL_CAPSULE | Freq: Four times a day (QID) | ORAL | Status: DC | PRN

## 2021-11-17 MED ORDER — DROPERIDOL 2.5 MG/ML IJ SOLN
0.6250 mg | Freq: Once | INTRAMUSCULAR | Status: AC | PRN
  Administered 2021-11-17: 0.625 mg via INTRAVENOUS

## 2021-11-17 MED ORDER — PROMETHAZINE HCL 25 MG/ML IJ SOLN: 6.2500 mg | INTRAMUSCULAR | Status: DC | PRN

## 2021-11-17 MED ORDER — MIDAZOLAM HCL 2 MG/2ML IJ SOLN
INTRAMUSCULAR | Status: AC
  Filled 2021-11-17: qty 2

## 2021-11-17 MED ORDER — SODIUM CHLORIDE 0.9 % IR SOLN
Status: DC | PRN
  Administered 2021-11-17: 500 mL

## 2021-11-17 MED ORDER — SODIUM CHLORIDE 0.9 % IV SOLN: INTRAVENOUS | Status: DC

## 2021-11-17 MED ORDER — STERILE WATER FOR IRRIGATION IR SOLN
Status: DC | PRN
  Administered 2021-11-17: 1000 mL

## 2021-11-17 MED ORDER — PHENYLEPHRINE HCL-NACL 20-0.9 MG/250ML-% IV SOLN
INTRAVENOUS | Status: DC | PRN
  Administered 2021-11-17: 40 ug/min via INTRAVENOUS

## 2021-11-17 MED ORDER — ROCURONIUM BROMIDE 10 MG/ML (PF) SYRINGE
PREFILLED_SYRINGE | INTRAVENOUS | Status: AC
  Filled 2021-11-17: qty 10

## 2021-11-17 MED ORDER — LIDOCAINE HCL (CARDIAC) PF 100 MG/5ML IV SOSY
PREFILLED_SYRINGE | INTRAVENOUS | Status: DC | PRN
  Administered 2021-11-17: 100 mg via INTRAVENOUS

## 2021-11-17 MED ORDER — OXYCODONE HCL 5 MG PO TABS
ORAL_TABLET | ORAL | Status: AC
  Administered 2021-11-17: 5 mg via ORAL
  Filled 2021-11-17: qty 1

## 2021-11-17 MED ORDER — PROCHLORPERAZINE MALEATE 10 MG PO TABS: 10.0000 mg | ORAL_TABLET | Freq: Four times a day (QID) | ORAL | Status: DC | PRN

## 2021-11-17 MED ORDER — OXYCODONE HCL 5 MG PO TABS
5.0000 mg | ORAL_TABLET | ORAL | Status: DC | PRN
  Administered 2021-11-17: 5 mg via ORAL
  Filled 2021-11-17: qty 1

## 2021-11-17 MED ORDER — DIPHENHYDRAMINE HCL 50 MG/ML IJ SOLN: 25.0000 mg | Freq: Four times a day (QID) | INTRAMUSCULAR | Status: DC | PRN

## 2021-11-17 MED ORDER — DEXAMETHASONE SODIUM PHOSPHATE 10 MG/ML IJ SOLN
INTRAMUSCULAR | Status: DC | PRN
  Administered 2021-11-17: 10 mg via INTRAVENOUS

## 2021-11-17 MED ORDER — BUPROPION HCL ER (XL) 150 MG PO TB24
150.0000 mg | ORAL_TABLET | Freq: Every day | ORAL | Status: DC
  Administered 2021-11-18: 150 mg via ORAL
  Filled 2021-11-17: qty 1

## 2021-11-17 MED ORDER — ACETAMINOPHEN 500 MG PO TABS
ORAL_TABLET | ORAL | Status: AC
  Administered 2021-11-17: 1000 mg via ORAL
  Filled 2021-11-17: qty 2

## 2021-11-17 MED ORDER — KETOROLAC TROMETHAMINE 30 MG/ML IJ SOLN
INTRAMUSCULAR | Status: AC
  Filled 2021-11-17: qty 1

## 2021-11-17 MED ORDER — ENOXAPARIN SODIUM 40 MG/0.4ML IJ SOSY
40.0000 mg | PREFILLED_SYRINGE | INTRAMUSCULAR | Status: DC
  Administered 2021-11-18: 40 mg via SUBCUTANEOUS
  Filled 2021-11-17: qty 0.4

## 2021-11-17 MED ORDER — PROPOFOL 10 MG/ML IV BOLUS
INTRAVENOUS | Status: DC | PRN
  Administered 2021-11-17: 160 mg via INTRAVENOUS

## 2021-11-17 MED ORDER — GABAPENTIN 300 MG PO CAPS
ORAL_CAPSULE | ORAL | Status: AC
  Administered 2021-11-17: 300 mg via ORAL
  Filled 2021-11-17: qty 1

## 2021-11-17 MED ORDER — SUGAMMADEX SODIUM 200 MG/2ML IV SOLN
INTRAVENOUS | Status: DC | PRN
  Administered 2021-11-17: 200 mg via INTRAVENOUS

## 2021-11-17 SURGICAL SUPPLY — 61 items
APPLICATOR VISTASEAL 35 (MISCELLANEOUS) ×1 IMPLANT
BAG RETRIEVAL 10 (BASKET)
BLADE SURG 15 STRL LF DISP TIS (BLADE) ×1 IMPLANT
BLADE SURG 15 STRL SS (BLADE) ×1
CANNULA REDUC XI 12-8 STAPL (CANNULA) ×1
CANNULA REDUCER 12-8 DVNC XI (CANNULA) ×1 IMPLANT
DERMABOND ADVANCED (GAUZE/BANDAGES/DRESSINGS) ×1
DERMABOND ADVANCED .7 DNX12 (GAUZE/BANDAGES/DRESSINGS) ×1 IMPLANT
DRAPE 3/4 80X56 (DRAPES) ×2 IMPLANT
DRAPE ARM DVNC X/XI (DISPOSABLE) ×4 IMPLANT
DRAPE COLUMN DVNC XI (DISPOSABLE) ×1 IMPLANT
DRAPE DA VINCI XI ARM (DISPOSABLE) ×4
DRAPE DA VINCI XI COLUMN (DISPOSABLE) ×1
ELECT CAUTERY BLADE 6.4 (BLADE) ×2 IMPLANT
ELECT REM PT RETURN 9FT ADLT (ELECTROSURGICAL) ×2
ELECTRODE REM PT RTRN 9FT ADLT (ELECTROSURGICAL) ×1 IMPLANT
GLOVE BIO SURGEON STRL SZ7 (GLOVE) ×6 IMPLANT
GOWN STRL REUS W/ TWL LRG LVL3 (GOWN DISPOSABLE) ×4 IMPLANT
GOWN STRL REUS W/TWL LRG LVL3 (GOWN DISPOSABLE) ×4
GRASPER LAPSCPC 5X45 DSP (INSTRUMENTS) ×2 IMPLANT
IRRIGATION STRYKERFLOW (MISCELLANEOUS) IMPLANT
IRRIGATOR STRYKERFLOW (MISCELLANEOUS) ×2
IV NS 1000ML (IV SOLUTION) ×1
IV NS 1000ML BAXH (IV SOLUTION) IMPLANT
KIT PINK PAD W/HEAD ARE REST (MISCELLANEOUS) ×2
KIT PINK PAD W/HEAD ARM REST (MISCELLANEOUS) ×1 IMPLANT
KIT TURNOVER CYSTO (KITS) ×2 IMPLANT
LABEL OR SOLS (LABEL) ×2 IMPLANT
MANIFOLD NEPTUNE II (INSTRUMENTS) ×2 IMPLANT
MESH BIO-A 7X10 SYN MAT (Mesh General) ×1 IMPLANT
NEEDLE HYPO 22GX1.5 SAFETY (NEEDLE) ×2 IMPLANT
OBTURATOR OPTICAL STANDARD 8MM (TROCAR) ×1
OBTURATOR OPTICAL STND 8 DVNC (TROCAR) ×1
OBTURATOR OPTICALSTD 8 DVNC (TROCAR) ×1 IMPLANT
PACK LAP CHOLECYSTECTOMY (MISCELLANEOUS) ×2 IMPLANT
PENCIL ELECTRO HAND CTR (MISCELLANEOUS) ×2 IMPLANT
SEAL CANN UNIV 5-8 DVNC XI (MISCELLANEOUS) ×3 IMPLANT
SEAL XI 5MM-8MM UNIVERSAL (MISCELLANEOUS) ×3
SEALER VESSEL DA VINCI XI (MISCELLANEOUS) ×1
SEALER VESSEL EXT DVNC XI (MISCELLANEOUS) ×1 IMPLANT
SOLUTION ELECTROLUBE (MISCELLANEOUS) ×2 IMPLANT
SPIKE FLUID TRANSFER (MISCELLANEOUS) ×2 IMPLANT
SPONGE T-LAP 18X18 ~~LOC~~+RFID (SPONGE) ×2 IMPLANT
STAPLER CANNULA SEAL DVNC XI (STAPLE) ×1 IMPLANT
STAPLER CANNULA SEAL XI (STAPLE) ×1
SUT MNCRL 4-0 (SUTURE) ×1
SUT MNCRL 4-0 27XMFL (SUTURE) ×1
SUT SILK 2 0 SH (SUTURE) ×6 IMPLANT
SUT VIC AB 3-0 SH 27 (SUTURE)
SUT VIC AB 3-0 SH 27X BRD (SUTURE) IMPLANT
SUT VICRYL 0 AB UR-6 (SUTURE) ×4 IMPLANT
SUT VLOC 90 S/L VL9 GS22 (SUTURE) ×2 IMPLANT
SUTURE MNCRL 4-0 27XMF (SUTURE) ×1 IMPLANT
SYR 20ML LL LF (SYRINGE) ×2 IMPLANT
SYR 30ML LL (SYRINGE) ×2 IMPLANT
SYS BAG RETRIEVAL 10MM (BASKET)
SYSTEM BAG RETRIEVAL 10MM (BASKET) IMPLANT
TRAY FOLEY SLVR 16FR LF STAT (SET/KITS/TRAYS/PACK) ×2 IMPLANT
TROCAR XCEL NON-BLD 5MMX100MML (ENDOMECHANICALS) ×2 IMPLANT
TUBING EVAC SMOKE HEATED PNEUM (TUBING) ×2 IMPLANT
WATER STERILE IRR 500ML POUR (IV SOLUTION) ×2 IMPLANT

## 2021-11-17 NOTE — Op Note (Addendum)
Robotic assisted laparoscopic repair of  paraesophageal hernia with Mesh 10x 7 cms Bio-A with Nissen fundoplication  Pre-operative Diagnosis: Recurrent paraesophageal hernia  Post-operative Diagnosis: same  Procedure:  Robotic assisted laparoscopic repair of  paraesophageal hernia with Mesh 10x 7 cms Bio-A with Nissen fundoplication   Surgeon: Caroleen Hamman, MD FACS  Assistant: [place] RNFA Required due to the complexity of the case the need for exposure  Anesthesia: Gen. with endotracheal tube  Findings: Recurrent paraesophageal hernia with GE junction and fundoplication within mediastinum Weak right and left crus Loose wrap in need for reinforcement Restoration of abdominal anatomy at the conclusion of the case, good esophageal length  Estimated Blood Loss: 25cc             Complications: none   Procedure Details  The patient was seen again in the Holding Room. The benefits, complications, treatment options, and expected outcomes were discussed with the patient. The risks of bleeding, infection, recurrence of symptoms, failure to resolve symptoms,  esophageal damage, Dysphagia, bowel injury, any of which could require further surgery were reviewed with the patient. The likelihood of improving the patient's symptoms with return to their baseline status is good.  The patient and/or family concurred with the proposed plan, giving informed consent.  The patient was taken to Operating Room, identified  and the procedure verified.  A Time Out was held and the above information confirmed.  Prior to the induction of general anesthesia, antibiotic prophylaxis was administered. VTE prophylaxis was in place. General endotracheal anesthesia was then administered and tolerated well. After the induction, the abdomen was prepped with Duraprep and draped in the sterile fashion. The patient was positioned in the supine position.  Cut down technique was used to enter the abdominal cavity and  a Hasson trochar was placed after two vicryl stitches were anchored to the fascia. Pneumoperitoneum was then created with CO2 and tolerated well without any adverse changes in the patient's vital signs.  Three 8-mm ports were placed under direct vision. All skin incisions  were infiltrated with a local anesthetic agent before making the incision and placing the trocars. An additional 5 mm regular laparoscopic port was placed to assist with retraction and exposure.   The patient was positioned  in reverse Trendelenburg, robot was brought to the surgical field and docked in the standard fashion.  We made sure all the instrumentation was kept indirect view at all times and that there were no collision between the arms. I scrubbed out and went to the console.  I used a Robotic arm to retract the liver, the vessel sealer on my right hand and a forced bipolar grasper on my left hand.  There is along the extra 5 mm port allow me ample exposure and the ability to perform meticulous dissection  We Started dividing the lesser omentum via the pars flaccida.   We visualized the recurrence as the cultprit, he had an intact but loose fundoplication.  We Were able to dissect the lesser curvature of the stomach and  dissected the fundus free from the right and left crus.  We circumferentially dissected the GE junction.  The hernia sac was also completely reduced and we were able to bring the stomach into the intra-abdominal position.  Attention then was turned to the greater curvature where the short gastrics were divided with sealer device.  We were able to identify the left crus and again were able to make sure there was a good circumferential dissection and that  the hernia sac was completely excised.  We did perform a very good dissection within the mediastinum to allow a complete reduction of the sac, obtain good esophageal mobility and a to completely allow an intra-abdominal Nissen fundoplication. I placed two  strips of Bio-A and reinforced the crus with interrupted 2-0 vicryl sutures. Bio-A mesh was placed and lay really nicely. I also made sure a had a good landing zone within the diaphragm for the mesh. Vistaseal was used to further secure the mesh .   We Asked anesthesia to place a 50 French bougie and this went easily.  We also observe trajectory of the bougie.I reinforced the Nissen fundoplication  with multiple 2-0 silk sutures since it was a bit loose. .  The fundoplication measured approximately 3-1/2 cm and he was floppy. I was very happy with the way the fundoplication laid and the repair of the hernia.  Inspection of the  upper quadrant was performed. No bleeding, bile  Or esophageal injuries leaks, or bowel injuries were noted. Robotic instruments and robotic arms were undocked in the standard fashion. All the needles were removed under direct visualization.   I scrubbed back in.  Pneumoperitoneum was released.  The periumbilical port site was closed with interrumpted 0 Vicryl sutures. 4-0 subcuticular Monocryl was used to close the skin. Liposomal marcaine was injected to all the incisions sites.  Dermabond was  applied.  The patient was then extubated and brought to the recovery room in stable condition. Sponge, lap, and needle counts were correct at closure and at the conclusion of the case.               Caroleen Hamman, MD, FACS   Please note that I amended the record, I used a template for the first portion of the operative report stating that Chloraprep was used. It should be noted that We did not use Chroraprep but DuraPrep instead. I clarified with the pt.

## 2021-11-17 NOTE — Transfer of Care (Signed)
Immediate Anesthesia Transfer of Care Note  Patient: Nathan Moody  Procedure(s) Performed: XI ROBOTIC ASSISTED PARAESOPHAGEAL HERNIA REPAIR, (Abdomen)  Patient Location: PACU  Anesthesia Type:General  Level of Consciousness: drowsy  Airway & Oxygen Therapy: Patient Spontanous Breathing and Patient connected to face mask oxygen  Post-op Assessment: Report given to RN, Post -op Vital signs reviewed and stable and Patient moving all extremities  Post vital signs: Reviewed and stable  Last Vitals:  Vitals Value Taken Time  BP 122/86 11/17/21 1115  Temp    Pulse 95 11/17/21 1119  Resp 18 11/17/21 1119  SpO2 95 % 11/17/21 1119  Vitals shown include unvalidated device data.  Last Pain:  Vitals:   11/17/21 2774  TempSrc: Temporal  PainSc: 0-No pain         Complications: No notable events documented.

## 2021-11-17 NOTE — Anesthesia Procedure Notes (Signed)
Procedure Name: Intubation Date/Time: 11/17/2021 7:40 AM Performed by: Esaw Grandchild, CRNA Pre-anesthesia Checklist: Patient identified, Emergency Drugs available, Suction available and Patient being monitored Patient Re-evaluated:Patient Re-evaluated prior to induction Oxygen Delivery Method: Circle system utilized Preoxygenation: Pre-oxygenation with 100% oxygen Induction Type: IV induction Ventilation: Mask ventilation without difficulty Laryngoscope Size: Miller and 2 Grade View: Grade I Tube type: Oral Tube size: 8.0 mm Number of attempts: 1 Airway Equipment and Method: Stylet, Oral airway, LTA kit utilized and Bite block Placement Confirmation: ETT inserted through vocal cords under direct vision, positive ETCO2 and breath sounds checked- equal and bilateral Secured at: 24 cm Tube secured with: Tape Dental Injury: Teeth and Oropharynx as per pre-operative assessment

## 2021-11-17 NOTE — Plan of Care (Signed)
  Problem: Education: Goal: Knowledge of General Education information will improve Description Including pain rating scale, medication(s)/side effects and non-pharmacologic comfort measures Outcome: Progressing   Problem: Health Behavior/Discharge Planning: Goal: Ability to manage health-related needs will improve Outcome: Progressing   

## 2021-11-17 NOTE — Interval H&P Note (Signed)
History and Physical Interval Note:  11/17/2021 7:17 AM  Nathan Moody  has presented today for surgery, with the diagnosis of paraesophageal hernia.  The various methods of treatment have been discussed with the patient and family. After consideration of risks, benefits and other options for treatment, the patient has consented to  Procedure(s) with comments: XI ROBOTIC ASSISTED PARAESOPHAGEAL HERNIA REPAIR, (N/A) - RNFA to assist as a surgical intervention.  The patient's history has been reviewed, patient examined, no change in status, stable for surgery.  I have reviewed the patient's chart and labs.  Questions were answered to the patient's satisfaction.     Charlo

## 2021-11-18 ENCOUNTER — Observation Stay: Payer: BC Managed Care – PPO

## 2021-11-18 DIAGNOSIS — K449 Diaphragmatic hernia without obstruction or gangrene: Secondary | ICD-10-CM | POA: Diagnosis not present

## 2021-11-18 LAB — BASIC METABOLIC PANEL
Anion gap: 7 (ref 5–15)
BUN: 11 mg/dL (ref 6–20)
CO2: 27 mmol/L (ref 22–32)
Calcium: 8.7 mg/dL — ABNORMAL LOW (ref 8.9–10.3)
Chloride: 105 mmol/L (ref 98–111)
Creatinine, Ser: 1.05 mg/dL (ref 0.61–1.24)
GFR, Estimated: >60 mL/min (ref >60)
Glucose, Bld: 93 mg/dL (ref 70–99)
Potassium: 4.2 mmol/L (ref 3.5–5.1)
Sodium: 139 mmol/L (ref 135–145)

## 2021-11-18 LAB — CBC
HCT: 42.2 % (ref 39.0–52.0)
Hemoglobin: 14.5 g/dL (ref 13.0–17.0)
MCH: 31.3 pg (ref 26.0–34.0)
MCHC: 34.4 g/dL (ref 30.0–36.0)
MCV: 91.1 fL (ref 80.0–100.0)
Platelets: 170 10*3/uL (ref 150–400)
RBC: 4.63 MIL/uL (ref 4.22–5.81)
RDW: 12.8 % (ref 11.5–15.5)
WBC: 8.1 10*3/uL (ref 4.0–10.5)
nRBC: 0 % (ref 0.0–0.2)

## 2021-11-18 MED ORDER — PREGABALIN 100 MG PO CAPS: 100.0000 mg | ORAL_CAPSULE | Freq: Three times a day (TID) | ORAL | 0 refills | Status: DC

## 2021-11-18 MED ORDER — IOHEXOL 300 MG/ML  SOLN
150.0000 mL | Freq: Once | INTRAMUSCULAR | Status: AC | PRN
  Administered 2021-11-18: 150 mL via ORAL

## 2021-11-18 MED ORDER — OXYCODONE HCL 5 MG PO TABS: 5.0000 mg | ORAL_TABLET | Freq: Four times a day (QID) | ORAL | 0 refills | Status: DC | PRN

## 2021-11-18 MED ORDER — ONDANSETRON 4 MG PO TBDP: 4.0000 mg | ORAL_TABLET | Freq: Four times a day (QID) | ORAL | 0 refills | Status: DC | PRN

## 2021-11-18 MED ORDER — IBUPROFEN 600 MG PO TABS: 600.0000 mg | ORAL_TABLET | Freq: Four times a day (QID) | ORAL | 0 refills | Status: DC | PRN

## 2021-11-18 NOTE — Discharge Summary (Signed)
[facility] SURGICAL DISCHARGE SUMMARY  Patient ID: Nathan Moody MRN: [id number] DOB/AGE: [birth year] 51 y.o.  Admit date: 11/17/2021 Discharge date: 11/18/2021  Discharge Diagnoses Patient Active Problem List   Diagnosis Date Noted   S/P repair of paraesophageal hernia 06/09/2021    Consultants None  Procedures 11/17/2021:  Robotic assisted laparoscopic paraesophageal hernia repair with Nissen fundoplication   HPI: Nathan Moody is a 51 y.o. male with a history of recurrent paraesophageal hernia who presents to [facility] on 05/23 for scheduled surgery.   Hospital Course: Informed consent was obtained and documented, and patient underwent uneventful Robotic assisted laparoscopic paraesophageal hernia repair with Nissen fundoplication  (Dr Dahlia Byes, 11/17/2021).  Post-operatively, patient had some mild trouble swallowing but otherwise did well and wishes to go home. Advancement of patient's diet and ambulation were well-tolerated. The remainder of patient's hospital course was essentially unremarkable, and discharge planning was initiated accordingly with patient safely able to be discharged home with appropriate discharge instructions, pain control, and outpatient follow-up after all of his questions were answered to his expressed satisfaction.   Discharge Condition: Good   Physical Examination:  Constitutional: Well appearing male, NAD Pulmonary: Normal effort, no respiratory distress Gastrointestinal: Soft, incisional soreness, mild, non-distended, no rebound/guarding Skin: Laparoscopic incisions are CDI with dermabond, no erythema or drainage    Allergies as of 11/18/2021       Reactions   Hibiclens [chlorhexidine Gluconate] Rash        Medication List     TAKE these medications    ALPRAZolam 0.25 MG tablet Commonly known as: XANAX Take 0.25 mg by mouth as needed for anxiety.   buPROPion 150 MG 24 hr tablet Commonly known as: WELLBUTRIN XL Take  150 mg by mouth in the morning.   busPIRone 10 MG tablet Commonly known as: BUSPAR Take 10 mg by mouth in the morning, at noon, and at bedtime.   ibuprofen 600 MG tablet Commonly known as: ADVIL Take 1 tablet (600 mg total) by mouth every 6 (six) hours as needed.   lamoTRIgine 25 MG tablet Commonly known as: LAMICTAL Take 50 mg by mouth at bedtime.   omeprazole 20 MG tablet Commonly known as: PRILOSEC OTC Take 20 mg by mouth as needed.   oxyCODONE 5 MG immediate release tablet Commonly known as: Oxy IR/ROXICODONE Take 1 tablet (5 mg total) by mouth every 6 (six) hours as needed for severe pain or breakthrough pain.   propranolol 20 MG tablet Commonly known as: INDERAL Take 20 mg by mouth as needed. 4 x daily   SYSTANE CONTACTS OP Place 1-2 drops into both eyes 3 (three) times daily as needed (dryness/irritation.).   traZODone 100 MG tablet Commonly known as: DESYREL Take 100 mg by mouth at bedtime.   valACYclovir 500 MG tablet Commonly known as: VALTREX Take 500 mg by mouth in the morning and at bedtime.   Wegovy 1.7 MG/0.75ML Soaj Generic drug: Semaglutide-Weight Management Inject 1.7 mg into the skin every Sunday.          Follow-up Information     Pabon, [place] F, MD. Schedule an appointment as soon as possible for a visit in 2 week(s).   Specialty: General Surgery Why: s/p paraesophageal hernia repair Contact information: [address] [place] [address] [phone]                  Time spent on discharge management including discussion of hospital course, clinical condition, outpatient instructions, prescriptions, and follow up with the  patient and members of the medical team: >30 minutes  -- Edison Simon , PA-C [facility]  11/18/2021, 8:33 AM [phone] M-F: 7am - 4pm

## 2021-11-18 NOTE — TOC Initial Note (Signed)
Transition of Care [facility]) - Initial/Assessment Note    Patient Details  Name: Nathan Moody MRN: [id number] Date of Birth: 02/03/1971  Transition of Care [facility]) CM/SW Contact:    Beverly Sessions, RN Phone Number: 11/18/2021, 10:02 AM  Clinical Narrative:                   Transition of Care (TOC) Screening Note   Patient Details  Name: Nathan Moody Date of Birth: [birth year]   Transition of Care [facility]) CM/SW Contact:    Beverly Sessions, RN Phone Number: 11/18/2021, 10:02 AM    Transition of Care Department (TOC) has reviewed patient and no TOC needs have been identified at this time. We will continue to monitor patient advancement through interdisciplinary progression rounds. If new patient transition needs arise, please place a TOC consult.        Patient Goals and CMS Choice        Expected Discharge Plan and Services           Expected Discharge Date: 11/18/21                                    Prior Living Arrangements/Services                       Activities of Daily Living Home Assistive Devices/Equipment: None ADL Screening (condition at time of admission) Patient's cognitive ability adequate to safely complete daily activities?: Yes Is the patient deaf or have difficulty hearing?: No Does the patient have difficulty seeing, even when wearing glasses/contacts?: No Does the patient have difficulty concentrating, remembering, or making decisions?: No Patient able to express need for assistance with ADLs?: Yes Does the patient have difficulty dressing or bathing?: No Independently performs ADLs?: Yes (appropriate for developmental age) Does the patient have difficulty walking or climbing stairs?: No Weakness of Legs: None Weakness of Arms/Hands: None  Permission Sought/Granted                  Emotional Assessment              Admission diagnosis:  S/P repair of paraesophageal hernia [F00.712, Z87.19] Patient  Active Problem List   Diagnosis Date Noted   Rash and nonspecific skin eruption 06/22/2021   S/P repair of paraesophageal hernia 06/09/2021   Primary hypertension 19/75/8832   Umbilical hernia without obstruction and without gangrene 01/08/2019   Psychophysiological insomnia 09/29/2018   Mixed hyperlipidemia 07/03/2018   Overweight 06/30/2018   Healthcare maintenance 02/17/2018   Depression 02/02/2018   PCP:  Damien Fusi, DO Pharmacy:   [facility] [phone] [address], [place] AT [facility] [address] [place] [address] Phone: [phone] Fax: [phone]     Social Determinants of Health (SDOH) Interventions    Readmission Risk Interventions     View : No data to display.

## 2021-11-18 NOTE — Anesthesia Postprocedure Evaluation (Signed)
Anesthesia Post Note  Patient: Nathan Moody  Procedure(s) Performed: XI ROBOTIC ASSISTED PARAESOPHAGEAL HERNIA REPAIR, (Abdomen)  Patient location during evaluation: PACU Anesthesia Type: General Level of consciousness: awake and alert Pain management: pain level controlled Vital Signs Assessment: post-procedure vital signs reviewed and stable Respiratory status: spontaneous breathing, nonlabored ventilation and respiratory function stable Cardiovascular status: stable and tachycardic Postop Assessment: no apparent nausea or vomiting Anesthetic complications: no   No notable events documented.   Last Vitals:  Vitals:   11/18/21 0011 11/18/21 0346  BP: 108/72 100/70  Pulse: 94 86  Resp: 14 14  Temp: (!) 36.4 C 36.4 C  SpO2: 95% 97%    Last Pain:  Vitals:   11/18/21 1200  TempSrc:   PainSc: 0-No pain                 [place] Ouch

## 2021-11-18 NOTE — Discharge Instructions (Signed)
In addition to included general post-operative instructions,  Diet: Recommend following Nissen diet handout. This is typically recommended x8 weeks; discuss length in follow up.   Activity: No heavy lifting >20 pounds (children, pets, laundry, garbage) for 6 weeks, but light activity and walking are encouraged. Do not drive or drink alcohol if taking narcotic pain medications or having pain that might distract from driving.  Wound care: 2 days after surgery (05/25), you may shower/get incision wet with soapy water and pat dry (do not rub incisions), but no baths or submerging incision underwater until follow-up.   Medications: Resume all home medications. For mild to moderate pain: acetaminophen (Tylenol) or ibuprofen/naproxen (if no kidney disease). Combining Tylenol with alcohol can substantially increase your risk of causing liver disease. Narcotic pain medications, if prescribed, can be used for severe pain, though may cause nausea, constipation, and drowsiness. Do not combine Tylenol and Percocet (or similar) within a 6 hour period as Percocet (and similar) contain(s) Tylenol. If you do not need the narcotic pain medication, you do not need to fill the prescription.  Call office [phone] / [phone]) at any time if any questions, worsening pain, fevers/chills, bleeding, drainage from incision site, or other concerns.

## 2021-11-23 ENCOUNTER — Ambulatory Visit: Payer: BC Managed Care – PPO

## 2021-11-25 ENCOUNTER — Ambulatory Visit (INDEPENDENT_AMBULATORY_CARE_PROVIDER_SITE_OTHER): Payer: BC Managed Care – PPO | Admitting: Surgery

## 2021-11-25 ENCOUNTER — Other Ambulatory Visit: Payer: Self-pay

## 2021-11-25 ENCOUNTER — Encounter: Payer: Self-pay | Admitting: Surgery

## 2021-11-25 VITALS — BP 102/68 | HR 52 | Temp 97.9°F | Ht 72.0 in | Wt 181.8 lb

## 2021-11-25 DIAGNOSIS — K449 Diaphragmatic hernia without obstruction or gangrene: Secondary | ICD-10-CM

## 2021-11-25 DIAGNOSIS — Z09 Encounter for follow-up examination after completed treatment for conditions other than malignant neoplasm: Secondary | ICD-10-CM

## 2021-11-25 NOTE — Patient Instructions (Signed)

## 2021-11-25 NOTE — Progress Notes (Unsigned)
S/p redo rob paraesophageal hernia repair 5/23 No fevers or chills Tolerating Po No reflux  PE NAD Abd: soft, minimal appropriate incisional tenderness, no peritonitis Ext: no edema and well perfused  A/P Doing well

## 2021-11-26 ENCOUNTER — Encounter: Payer: Self-pay | Admitting: Surgery

## 2021-12-02 ENCOUNTER — Encounter: Payer: BC Managed Care – PPO | Admitting: Surgery

## 2021-12-05 ENCOUNTER — Emergency Department: Payer: BC Managed Care – PPO

## 2021-12-05 ENCOUNTER — Ambulatory Visit
Admission: RE | Admit: 2021-12-05 | Discharge: 2021-12-05 | Disposition: A | Discharge status: Home / Self Care | Payer: BC Managed Care – PPO | Source: Ambulatory Visit | Attending: Emergency Medicine | Admitting: Emergency Medicine

## 2021-12-05 ENCOUNTER — Observation Stay
Admission: EM | Admit: 2021-12-05 | Discharge: 2021-12-05 | Disposition: A | Discharge status: Home / Self Care | Payer: BC Managed Care – PPO | Attending: Internal Medicine | Admitting: Internal Medicine

## 2021-12-05 ENCOUNTER — Other Ambulatory Visit: Payer: Self-pay

## 2021-12-05 VITALS — BP 93/66 | HR 110 | Temp 98.2°F | Resp 16 | Ht 72.0 in | Wt 170.7 lb

## 2021-12-05 DIAGNOSIS — R112 Nausea with vomiting, unspecified: Secondary | ICD-10-CM | POA: Diagnosis present

## 2021-12-05 DIAGNOSIS — H1032 Unspecified acute conjunctivitis, left eye: Secondary | ICD-10-CM

## 2021-12-05 DIAGNOSIS — Z9889 Other specified postprocedural states: Secondary | ICD-10-CM | POA: Insufficient documentation

## 2021-12-05 DIAGNOSIS — K567 Ileus, unspecified: Principal | ICD-10-CM | POA: Diagnosis present

## 2021-12-05 DIAGNOSIS — R110 Nausea: Secondary | ICD-10-CM

## 2021-12-05 LAB — URINALYSIS, ROUTINE W REFLEX MICROSCOPIC
Glucose, UA: NEGATIVE mg/dL
Hgb urine dipstick: NEGATIVE
Ketones, ur: 20 mg/dL — AB
Leukocytes,Ua: NEGATIVE
Nitrite: NEGATIVE
Protein, ur: 30 mg/dL — AB
Specific Gravity, Urine: 1.036 — ABNORMAL HIGH (ref 1.005–1.030)
Squamous Epithelial / HPF: NONE SEEN (ref 0–5)
pH: 5 (ref 5.0–8.0)

## 2021-12-05 LAB — COMPREHENSIVE METABOLIC PANEL
ALT: 10 U/L (ref 0–44)
AST: 20 U/L (ref 15–41)
Albumin: 4.1 g/dL (ref 3.5–5.0)
Alkaline Phosphatase: 66 U/L (ref 38–126)
Anion gap: 7 (ref 5–15)
BUN: 15 mg/dL (ref 6–20)
CO2: 27 mmol/L (ref 22–32)
Calcium: 9.2 mg/dL (ref 8.9–10.3)
Chloride: 102 mmol/L (ref 98–111)
Creatinine, Ser: 1.05 mg/dL (ref 0.61–1.24)
GFR, Estimated: >60 mL/min (ref >60)
Glucose, Bld: 95 mg/dL (ref 70–99)
Potassium: 4.3 mmol/L (ref 3.5–5.1)
Sodium: 136 mmol/L (ref 135–145)
Total Bilirubin: 0.7 mg/dL (ref 0.3–1.2)
Total Protein: 7.6 g/dL (ref 6.5–8.1)

## 2021-12-05 LAB — LIPASE, BLOOD: Lipase: 60 U/L — ABNORMAL HIGH (ref 11–51)

## 2021-12-05 LAB — CBC
HCT: 45.1 % (ref 39.0–52.0)
Hemoglobin: 15.3 g/dL (ref 13.0–17.0)
MCH: 31.4 pg (ref 26.0–34.0)
MCHC: 33.9 g/dL (ref 30.0–36.0)
MCV: 92.4 fL (ref 80.0–100.0)
Platelets: 262 10*3/uL (ref 150–400)
RBC: 4.88 MIL/uL (ref 4.22–5.81)
RDW: 13.1 % (ref 11.5–15.5)
WBC: 7.2 10*3/uL (ref 4.0–10.5)
nRBC: 0 % (ref 0.0–0.2)

## 2021-12-05 MED ORDER — PROMETHAZINE HCL 25 MG RE SUPP: 25.0000 mg | Freq: Four times a day (QID) | RECTAL | 0 refills | Status: DC | PRN

## 2021-12-05 MED ORDER — IOHEXOL 300 MG/ML  SOLN
100.0000 mL | Freq: Once | INTRAMUSCULAR | Status: AC | PRN
  Administered 2021-12-05: 100 mL via INTRAVENOUS

## 2021-12-05 MED ORDER — METOCLOPRAMIDE HCL 5 MG/ML IJ SOLN
10.0000 mg | Freq: Once | INTRAMUSCULAR | Status: AC
  Administered 2021-12-05: 10 mg via INTRAVENOUS
  Filled 2021-12-05: qty 2

## 2021-12-05 MED ORDER — SODIUM CHLORIDE 0.9 % IV BOLUS
1000.0000 mL | Freq: Once | INTRAVENOUS | Status: AC
  Administered 2021-12-05: 1000 mL via INTRAVENOUS

## 2021-12-05 MED ORDER — CIPROFLOXACIN HCL 0.3 % OP SOLN: 2.0000 [drp] | OPHTHALMIC | 0 refills | Status: DC

## 2021-12-05 MED ORDER — METOCLOPRAMIDE HCL 10 MG PO TABS: 10.0000 mg | ORAL_TABLET | Freq: Three times a day (TID) | ORAL | 0 refills | Status: DC | PRN

## 2021-12-05 NOTE — ED Notes (Signed)
Patient has declined to stay. Dr. Joni Fears aware.

## 2021-12-05 NOTE — ED Triage Notes (Signed)
Pt here for left eye problem and nausea.   Thinks that he has pink eye in left eye.  Reports that he had hernia surgery 2 and half weeks ago. 3rd hernia surgery in a year.   Has been nauseated since Monday. Has ODT zofran that he has been taking without relief.   On full liquid diet. Has not had any vomiting. Denies any diarrhea, however has not had a BM since Sunday.   Endorses some dizziness when getting up too fast.   Has lost 55 pounds since February, however was on Wegovy, but not anymore. Has lost 3 and a half pounds this week.

## 2021-12-05 NOTE — ED Triage Notes (Signed)
Pt with 3 hernia surgeries since last April latest one was 3 weeks ago. Pt is on full clear liquid diet. Pt has had N/V since this past Sunday. Pt denies diarrhea. Pt also with pink eye to left eye. Pt denies abdominal pain.

## 2021-12-05 NOTE — Discharge Instructions (Signed)
Follow-up Dr. Dahlia Byes.  Return if worsening.

## 2021-12-05 NOTE — Discharge Instructions (Addendum)
Use eyedrops as directed to every 2-3 hours for the first day then every 4 hours while awake for total of 5 days.  Return sooner if new or worsening symptoms.  I am concerned about your persistent nausea post hiatal hernia surgery.  Stat labs and further diagnostics will be needed to understand the recent weight loss and nausea.  Go to ER now for further evaluation.

## 2021-12-05 NOTE — ED Notes (Signed)
Patient is being discharged from the Urgent Care and sent to the Emergency Department via POV . Per Leida Lauth, NP, patient is in need of higher level of care due to ongoing nausea post hernia repair, necessitating lab work. Patient is aware and verbalizes understanding of plan of care.  Vitals:   12/05/21 1052 12/05/21 1053  BP: 93/66   Pulse: (!) 110   Resp:    Temp:    SpO2:  98%

## 2021-12-05 NOTE — ED Provider Notes (Signed)
[place]   MRN: [id number] DOB: 04/15/1971  Subjective:   Chief Complaint;  Chief Complaint  Patient presents with   Eye Problem    Potentially have pink eye - Entered by patient   Pt here for left eye problem and nausea.    Thinks that he has pink eye in left eye.   Reports that he had hernia surgery 2 and half weeks ago. 3rd hernia surgery in a year.    Has been nauseated since Monday. Has ODT zofran that he has been taking without relief.    On full liquid diet. Has not had any vomiting. Denies any diarrhea, however has not had a BM since Sunday.    Endorses some dizziness when getting up too fast.    Has lost 55 pounds since February, however was on Wegovy, but not anymore. Has lost 3 and a half pounds this week Nathan Moody is a 51 y.o. male presenting for persistent nausea for the last 4 days with recent weight loss due to persistent nausea he rates at 9 out of 10 despite using Zofran.  Patient also has associated dizziness due to his lack of intake.  He also complains of new onset of redness and discharge from the left eye but denies visual changes.  Patient had gastric hiatal hernia repair 2 weeks ago.  No current facility-administered medications for this encounter.  Current Outpatient Medications:    ALPRAZolam (XANAX) 0.25 MG tablet, Take 0.25 mg by mouth as needed for anxiety., Disp: , Rfl:    Artificial Tear Solution (SYSTANE CONTACTS OP), Place 1-2 drops into both eyes 3 (three) times daily as needed (dryness/irritation.)., Disp: , Rfl:    buPROPion (WELLBUTRIN XL) 150 MG 24 hr tablet, Take 150 mg by mouth in the morning., Disp: , Rfl:    busPIRone (BUSPAR) 10 MG tablet, Take 10 mg by mouth in the morning, at noon, and at bedtime., Disp: , Rfl:    ciprofloxacin (CILOXAN) 0.3 % ophthalmic solution, Place 2 drops into the left eye every 2 (two) hours., Disp: 5 mL, Rfl: 0   ibuprofen (ADVIL) 600 MG tablet, Take 1 tablet (600 mg total) by mouth  every 6 (six) hours as needed., Disp: 30 tablet, Rfl: 0   lamoTRIgine (LAMICTAL) 25 MG tablet, Take 50 mg by mouth at bedtime., Disp: , Rfl:    omeprazole (PRILOSEC OTC) 20 MG tablet, Take 20 mg by mouth as needed., Disp: , Rfl:    ondansetron (ZOFRAN-ODT) 4 MG disintegrating tablet, Take 1 tablet (4 mg total) by mouth every 6 (six) hours as needed for nausea., Disp: 20 tablet, Rfl: 0   promethazine (PHENERGAN) 25 MG suppository, Place 1 suppository (25 mg total) rectally every 6 (six) hours as needed for nausea or vomiting., Disp: 12 each, Rfl: 0   propranolol (INDERAL) 20 MG tablet, Take 20 mg by mouth as needed. 4 x daily, Disp: , Rfl:    traZODone (DESYREL) 100 MG tablet, Take 100 mg by mouth at bedtime., Disp: , Rfl:    valACYclovir (VALTREX) 500 MG tablet, Take 500 mg by mouth in the morning and at bedtime., Disp: , Rfl:    WEGOVY 1.7 MG/0.75ML SOAJ, Inject 1.7 mg into the skin every Sunday., Disp: , Rfl:    pregabalin (LYRICA) 100 MG capsule, Take 1 capsule (100 mg total) by mouth 3 (three) times daily for 14 days., Disp: 42 capsule, Rfl: 0   Allergies  Allergen Reactions   Cyanoacrylate  Hibiclens [Chlorhexidine Gluconate] Rash    Past Medical History:  Diagnosis Date   Anxiety    Depression    GERD (gastroesophageal reflux disease)    Hepatitis    positive hep C , positive antibodies   Hypertension    recent weight loss , no meds   Paraesophageal hernia    Umbilical hernia      Review of Systems  All other systems reviewed and are negative.    Objective:   Vitals: BP 93/66 (BP Location: Right Arm)   Pulse (!) 110   Temp 98.2 F (36.8 C) (Oral)   Resp 16   Ht 6' (1.829 m)   Wt 170 lb 11.2 oz (77.4 kg)   SpO2 98%   BMI 23.15 kg/m   Physical Exam Vitals and nursing note reviewed.  Constitutional:      General: He is not in acute distress.    Appearance: He is well-developed.  HENT:     Head: Normocephalic and atraumatic.  Eyes:     General: Scleral  icterus present.        Left eye: Discharge present.    Conjunctiva/sclera: Conjunctivae normal.     Pupils: Pupils are equal, round, and reactive to light.  Cardiovascular:     Rate and Rhythm: Regular rhythm. Tachycardia present.     Heart sounds: No murmur heard. Pulmonary:     Effort: Pulmonary effort is normal. No respiratory distress.     Breath sounds: Normal breath sounds.  Abdominal:     General: Abdomen is flat.     Palpations: Abdomen is soft.     Tenderness: There is no abdominal tenderness.     Comments: Abdomen has no specific tenderness.  Mild hyperactivity of bowel sounds throughout without distention.  Positive multiple scabbed and healing surgical wounds from previous umbilical surgery present without secondary signs of infection  Musculoskeletal:        General: No swelling.     Cervical back: Neck supple.  Skin:    General: Skin is warm and dry.     Capillary Refill: Capillary refill takes less than 2 seconds.     Coloration: Skin is pale. Skin is not jaundiced.  Neurological:     General: No focal deficit present.     Mental Status: He is alert and oriented to person, place, and time.  Psychiatric:        Mood and Affect: Mood normal.     No results found for this or any previous visit (from the past 24 hour(s)).  No results found.     Assessment and Plan :   1. Nausea without vomiting   2. Acute bacterial conjunctivitis of left eye     Meds ordered this encounter  Medications   ciprofloxacin (CILOXAN) 0.3 % ophthalmic solution    Sig: Place 2 drops into the left eye every 2 (two) hours.    Dispense:  5 mL    Refill:  0   promethazine (PHENERGAN) 25 MG suppository    Sig: Place 1 suppository (25 mg total) rectally every 6 (six) hours as needed for nausea or vomiting.    Dispense:  12 each    Refill:  0    MDM:  Nathan Moody is a 51 y.o. male presenting for left eye conjunctivitis and ongoing persistent nausea not resolved with Zofran  status post hiatal hernia repair 2 weeks ago.  Patient is encouraged to go to the emergency room for stat lab work and further  diagnostics and evaluation.  I cannot rule out severe electrolyte abnormality, bowel obstruction, gastric outlet obstruction or other severe issues given the level of his nausea and recent surgery.  Patient states understanding of this and agrees to go to ER.  I discussed treatment, follow up and return instructions. Questions were answered. Patient/representative stated understanding of instructions and patient is stable for discharge. Leida Lauth FNP-C MCN    Hezzie Bump, NP 12/05/21 1225

## 2021-12-05 NOTE — ED Provider Notes (Signed)
[facility] Provider Note    Event Date/Time   First MD Initiated Contact with Patient 12/05/21 1228     (approximate)   History   Emesis   HPI  Nathan Moody is a 51 y.o. male with history of recent hiatal hernia repair presents to the emergency department complaining of nausea and vomiting with some abdominal discomfort.  Patient states that since the surgery he has been on clear liquids only but now cannot even tolerate liquids.  Denies any fever or chills.  No diarrhea.  No constipation.      Physical Exam   Triage Vital Signs: ED Triage Vitals  Enc Vitals Group     BP 12/05/21 1140 98/78     Pulse Rate 12/05/21 1140 94     Resp 12/05/21 1140 18     Temp 12/05/21 1140 98.3 F (36.8 C)     Temp Source 12/05/21 1140 Oral     SpO2 12/05/21 1140 96 %     Weight 12/05/21 1141 170 lb (77.1 kg)     Height 12/05/21 1141 6' (1.829 m)     Head Circumference --      Peak Flow --      Pain Score 12/05/21 1141 0     Pain Loc --      Pain Edu? --      Excl. in [place]? --     Most recent vital signs: Vitals:   12/05/21 1645 12/05/21 1657  BP:  107/79  Pulse: 82 85  Resp:  16  Temp:  98.3 F (36.8 C)  SpO2: 98% 99%     General: Awake, no distress.   CV:  Good peripheral perfusion. regular rate and  rhythm Resp:  Normal effort.  Abd:  No distention.  The abdomen is more rigid on the left than the right, bowel sounds present Other:      ED Results / Procedures / Treatments   Labs (all labs ordered are listed, but only abnormal results are displayed) Labs Reviewed  LIPASE, BLOOD - Abnormal; Notable for the following components:      Result Value   Lipase 60 (*)    All other components within normal limits  URINALYSIS, ROUTINE W REFLEX MICROSCOPIC - Abnormal; Notable for the following components:   Color, Urine AMBER (*)    APPearance HAZY (*)    Specific Gravity, Urine 1.036 (*)    Bilirubin Urine MODERATE (*)    Ketones, ur 20 (*)     Protein, ur 30 (*)    Bacteria, UA RARE (*)    All other components within normal limits  COMPREHENSIVE METABOLIC PANEL  CBC     EKG     RADIOLOGY CT abdomen/pelvis IV,    PROCEDURES:   Procedures   MEDICATIONS ORDERED IN ED: Medications  sodium chloride 0.9 % bolus 1,000 mL (0 mLs Intravenous Stopped 12/05/21 1623)  metoCLOPramide (REGLAN) injection 10 mg (10 mg Intravenous Given 12/05/21 1335)  iohexol (OMNIPAQUE) 300 MG/ML solution 100 mL (100 mLs Intravenous Contrast Given 12/05/21 1531)     IMPRESSION / MDM / ASSESSMENT AND PLAN / ED COURSE  I reviewed the triage vital signs and the nursing notes.                              Differential diagnosis includes, but is not limited to, perforation, bowel obstruction, gastroenteritis  Patient's presentation is most consistent with acute  complicated illness / injury requiring diagnostic workup.   The patient CBC and metabolic panel are reassuring.  Lipase has minimal elevation at 60, urinalysis shows moderate amount of bilirubin, 20 ketones but no infection.  Due to the elevated bilirubin along with the rigidity on the left side we will go ahead and order CT abdomen/pelvis with IV contrast.  CT interpreted by me as having acute ileus.  Consult to Dr. Dahlia Byes, states the ileus is not bad but we could admit for fluids and rehydration.  Does not feel that he needs an NG tube.  Consult hospitalist for admission.  Spoke with Dr. Manuella Ghazi.  He is admitting the patient for ileus and for rehydration.  Dr. Merdis Delay went in to see the patient.  Patient has now changed his mind and wants to go home.  Dr. Manuella Ghazi did discuss dangers of going home without treatment.  Patient still thinks he would like to go home with a prescription for Reglan.  He was discharged in stable condition with strict instructions to return if worsening.       FINAL CLINICAL IMPRESSION(S) / ED DIAGNOSES   Final diagnoses:  Ileus ([place])     Rx / DC Orders    ED Discharge Orders          Ordered    metoCLOPramide (REGLAN) 10 MG tablet  Every 8 hours PRN        12/05/21 1658             Note:  This document was prepared using Dragon voice recognition software and may include unintentional dictation errors.    Versie Starks, PA-C 12/05/21 Marshell Garfinkel, MD 12/06/21 Kathyrn Drown

## 2021-12-05 NOTE — ED Notes (Signed)
Patient given warm blanket per request.

## 2021-12-14 ENCOUNTER — Encounter: Payer: Self-pay | Admitting: Surgery

## 2021-12-14 ENCOUNTER — Ambulatory Visit (INDEPENDENT_AMBULATORY_CARE_PROVIDER_SITE_OTHER): Payer: BC Managed Care – PPO | Admitting: Surgery

## 2021-12-14 VITALS — BP 121/86 | HR 84 | Temp 98.0°F | Ht 72.0 in | Wt 177.8 lb

## 2021-12-14 DIAGNOSIS — K449 Diaphragmatic hernia without obstruction or gangrene: Secondary | ICD-10-CM

## 2021-12-14 DIAGNOSIS — Z09 Encounter for follow-up examination after completed treatment for conditions other than malignant neoplasm: Secondary | ICD-10-CM

## 2021-12-14 NOTE — Patient Instructions (Addendum)
Take it slow with introducing new foods. Chew very well before swallowing.    GENERAL POST-OPERATIVE PATIENT INSTRUCTIONS   WOUND CARE INSTRUCTIONS:  Keep a dry clean dressing on the wound if there is drainage. The initial bandage may be removed after 24 hours.  Once the wound has quit draining you may leave it open to air.  If clothing rubs against the wound or causes irritation and the wound is not draining you may cover it with a dry dressing during the daytime.  Try to keep the wound dry and avoid ointments on the wound unless directed to do so.  If the wound becomes bright red and painful or starts to drain infected material that is not clear, please contact your physician immediately.  If the wound is mildly pink and has a thick firm ridge underneath it, this is normal, and is referred to as a healing ridge.  This will resolve over the next 4-6 weeks.  BATHING: You may shower if you have been informed of this by your surgeon. However, Please do not submerge in a tub, hot tub, or pool until incisions are completely sealed or have been told by your surgeon that you may do so.  DIET:  You may eat any foods that you can tolerate.  It is a good idea to eat a high fiber diet and take in plenty of fluids to prevent constipation.  If you do become constipated you may want to take a mild laxative or take ducolax tablets on a daily basis until your bowel habits are regular.  Constipation can be very uncomfortable, along with straining, after recent surgery.  ACTIVITY:  You are encouraged to cough and deep breath or use your incentive spirometer if you were given one, every 15-30 minutes when awake.  This will help prevent respiratory complications and low grade fevers post-operatively if you had a general anesthetic.  You may want to hug a pillow when coughing and sneezing to add additional support to the surgical area, if you had abdominal or chest surgery, which will decrease pain during these times.  You  are encouraged to walk and engage in light activity for the next two weeks.  You should not lift more than 20 pounds for 6 weeks total after surgery as it could put you at increased risk for complications.  Twenty pounds is roughly equivalent to a plastic bag of groceries. At that time- Listen to your body when lifting, if you have pain when lifting, stop and then try again in a few days. Soreness after doing exercises or activities of daily living is normal as you get back in to your normal routine.  MEDICATIONS:  Try to take narcotic medications and anti-inflammatory medications, such as tylenol, ibuprofen, naprosyn, etc., with food.  This will minimize stomach upset from the medication.  Should you develop nausea and vomiting from the pain medication, or develop a rash, please discontinue the medication and contact your physician.  You should not drive, make important decisions, or operate machinery when taking narcotic pain medication.  SUNBLOCK Use sun block to incision area over the next year if this area will be exposed to sun. This helps decrease scarring and will allow you avoid a permanent darkened area over your incision.  QUESTIONS:  Please feel free to call our office if you have any questions, and we will be glad to assist you. [phone]

## 2021-12-18 ENCOUNTER — Encounter: Payer: Self-pay | Admitting: Surgery

## 2021-12-18 NOTE — Progress Notes (Signed)
Jahlen is 3-1/2 weeks after redo paraesophageal hernia repair.  He is doing well.  Had a recent visit to the ER due to some bloating and he attributed this to multivitamins. His episode has resolved.  He experiences no reflux. Mild aspect of soreness. Some very mild dysphagia.  No evidence of food impaction NO Fevers and chills  PE NAD Abd: soft, incision c/d/I, no infection or peritonitis  A/P doing well without complications. RTC 2 months

## 2021-12-21 LAB — COLOGUARD: COLOGUARD: NEGATIVE

## 2022-02-08 ENCOUNTER — Encounter: Payer: Self-pay | Admitting: Surgery

## 2022-02-08 ENCOUNTER — Ambulatory Visit (INDEPENDENT_AMBULATORY_CARE_PROVIDER_SITE_OTHER): Payer: BC Managed Care – PPO | Admitting: Surgery

## 2022-02-08 VITALS — BP 94/64 | HR 97 | Temp 98.1°F | Wt 172.6 lb

## 2022-02-08 DIAGNOSIS — Z09 Encounter for follow-up examination after completed treatment for conditions other than malignant neoplasm: Secondary | ICD-10-CM

## 2022-02-08 DIAGNOSIS — K449 Diaphragmatic hernia without obstruction or gangrene: Secondary | ICD-10-CM

## 2022-02-08 NOTE — Patient Instructions (Addendum)
If you have any concerns or questions, please feel free to call our office. Follow up as needed.   Laparoscopic Nissen Fundoplication  Laparoscopic Nissen fundoplication is a surgery to relieve heartburn and other problems caused by fluid from your stomach (gastric fluids) flowing up into your esophagus. The esophagus is the part of the body that moves food from the mouth to the stomach. Normally, the muscle that sits between the stomach and the esophagus (lower esophageal sphincter, LES) keeps stomach fluids in the stomach. In some people, the LES does not work properly, and stomach fluids flow up into the esophagus (reflux). This can happen when part of the stomach bulges through the LES (hiatal hernia). The backward flow of stomach fluids can cause a type of severe and long-lasting heartburn that is called gastroesophageal reflux disease (GERD). You may need this surgery if other treatments for GERD have not helped. In this procedure, the upper part of your stomach is wrapped around the lower end of your esophagus and stitched together (sutured). This tightens the connection between your esophagus and stomach to prevent stomach acid reflux. Tell a health care provider about: Any allergies you have. All medicines you are taking, including vitamins, herbs, eye drops, creams, and over-the-counter medicines. Any problems you or family members have had with anesthetic medicines. Any blood disorders you have. Any surgeries you have had. Any medical conditions you have. Whether you are pregnant or may be pregnant. What are the risks? Generally, this is a safe procedure. However, problems may occur, including: Infection. Bleeding. Damage to other structures or organs. This can include damage to the lung, causing a collapsed lung. Trouble swallowing (dysphagia). Blood clots. Allergic reactions to medicines. What happens before the procedure? Staying hydrated Follow instructions from your health  care provider about hydration, which may include: Up to two hours before the procedure - you may continue to drink clear liquids, such as water, clear fruit juice, black coffee and plain tea. Eating and drinking restrictions Follow instructions from your health care provider about eating and drinking, which may include: 8 hours before the procedure - stop eating heavy meals or foods, such as meat, fried foods, or fatty foods. 6 hours before the procedure - stop eating light meals or foods, such as toast or cereal. 6 hours before the procedure - stop drinking milk or drinks that contain milk. 2 hours before the procedure - stop drinking clear liquids. Medicines Ask your health care provider about: Changing or stopping your regular medicines. This is especially important if you are taking diabetes medicines or blood thinners. Taking medicines such as aspirin and ibuprofen. These medicines can thin your blood. Do not take these medicines unless your health care provider tells you to take them. Taking over-the-counter medicines, vitamins, herbs, and supplements. Tests Your health care provider will do tests to plan the procedure. This may include: An exam using a flexible scope passed down your esophagus into your stomach (endoscopy). Imaging studies. General instructions Plan to have a responsible adult take you home from the hospital or clinic. Ask your health care provider: How your surgery site will be marked. What steps will be taken to help prevent infection. These steps may include: Removing hair at the surgery site. Washing skin with a germ-killing soap. Taking antibiotic medicine. Do not use any products that contain nicotine or tobacco for at least 4 weeks before the procedure. These products include cigarettes, chewing tobacco, and vaping devices, such as e-cigarettes. If you need help quitting, ask  your health care provider. What happens during the procedure? An IV will be inserted  into one of your veins. You will be given medicine in your IV to help you relax (sedative) just before the procedure and a medicine to make you fall asleep (general anesthetic). You may have a tube placed through your nose into your stomach to drain stomach acid during the procedure (nasogastric tube). The surgeon will make a small incision in your abdomen and insert a tube through the incision. Your abdomen will be filled with a gas. This helps the surgeon see your organs better, and it makes more space to work. The surgeon will insert a thin, lighted tube (laparoscope) through the small incision. This allows your surgeon to see into your abdomen. The surgeon will make several other small incisions in your abdomen to insert the other instruments that are needed during the procedure. Another instrument (dilator) will be passed through your mouth and down your esophagus into the upper part of your stomach. The dilator will prevent your LES from being closed too tightly during surgery. The upper part of your stomach will be wrapped around the lower part of your esophagus and will be stitched into place. This will strengthen the lower esophageal sphincter and prevent reflux. If you have a hiatal hernia, it will be repaired. The gas will be released from your abdomen. All instruments will be removed, and the incisions will be closed with stitches (sutures). A bandage (dressing) will be placed on your skin over the incisions. The procedure may vary among health care providers and hospitals. What happens after the procedure? Your blood pressure, heart rate, breathing rate, and blood oxygen level will be monitored until you leave the hospital or clinic. You will be given pain medicine as needed. Your IV will be kept in until you are able to drink fluids. You will be encouraged to get up and walk around as soon as possible. Summary Laparoscopic Nissen fundoplication is a surgery to relieve heartburn and  other problems caused by gastric fluids flowing up into your esophagus. You may need this surgery if other treatments for GERD have not helped. Follow instructions from your health care provider about eating and drinking before the procedure. Your surgeon will use a thin, lighted tube (laparoscope) that is inserted through a small incision, allowing the surgeon to see into your abdomen. This information is not intended to replace advice given to you by your health care provider. Make sure you discuss any questions you have with your health care provider. Document Revised: 12/30/2019 Document Reviewed: 12/30/2019 Elsevier Patient Education  2023 [employer].

## 2022-02-09 NOTE — Progress Notes (Signed)
Outpatient Surgical Follow Up  02/09/2022  Nathan Moody is an 51 y.o. male.   Chief Complaint  Patient presents with   Routine Post Op    Paraesophageal hernia repair 11/17/21    HPI: Nathan Moody after redo paraesophageal hernia repair.  Doing well.  No fevers no chills tolerating diet well.  Some occasional abdominal discomfort.  No reflux  Past Medical History:  Diagnosis Date   Anxiety    Depression    GERD (gastroesophageal reflux disease)    Hepatitis    positive hep C , positive antibodies   Hypertension    recent weight loss , no meds   Paraesophageal hernia    Umbilical hernia     Past Surgical History:  Procedure Laterality Date   INSERTION OF MESH  06/09/2021   Procedure: INSERTION OF MESH;  Surgeon: Leafy Ro, MD;  Location: [facility] ORS;  Service: General;;   UMBILICAL HERNIA REPAIR  10/21/2020   and epigastric hernia repair   VENTRAL HERNIA REPAIR N/A 06/09/2021   Procedure: HERNIA REPAIR VENTRAL ADULT, incisional;  Surgeon: Leafy Ro, MD;  Location: [facility] ORS;  Service: General;  Laterality: N/A;   XI ROBOTIC ASSISTED HIATAL HERNIA REPAIR N/A 06/09/2021   Procedure: XI ROBOTIC ASSISTED HIATAL HERNIA REPAIR, RNFA to assist;  Surgeon: Leafy Ro, MD;  Location: [facility] ORS;  Service: General;  Laterality: N/A;   XI ROBOTIC ASSISTED PARAESOPHAGEAL HERNIA REPAIR N/A 11/17/2021   Procedure: XI ROBOTIC ASSISTED PARAESOPHAGEAL HERNIA REPAIR,;  Surgeon: Leafy Ro, MD;  Location: [facility] ORS;  Service: General;  Laterality: N/A;  RNFA to assist    History reviewed. No pertinent family history.  Social History:  reports that he has never smoked. He has never been exposed to tobacco smoke. He has never used smokeless tobacco. He reports current alcohol use. He reports that he does not use drugs.  Allergies:  Allergies  Allergen Reactions   Cyanoacrylate    Hibiclens [Chlorhexidine Gluconate] Rash    Medications reviewed.    ROS Full ROS performed and is  otherwise negative other than what is stated in HPI   BP 94/64   Pulse 97   Temp 98.1 F (36.7 C) (Oral)   Wt 172 lb 9.6 oz (78.3 kg)   SpO2 96%   BMI 23.41 kg/m   Physical Exam NAd alert Chest: CTA NSR Abd: soft, incisions c/d/I  Assessment/Plan: Mr. Nathan Moody is following 2 months after redo paraesophageal hernia.  He has no issues and is doing very well some occasional abdominal discomfort Evidence of reflux no evidence of dysphagia RTC as needed  Sterling Big, MD [facility] General Surgeon

## 2022-06-06 IMAGING — RF DG ESOPHAGUS
8 of 10 series · 14 of 24 positions shown · non-contrast
Comparison: 10/26/2021

CLINICAL DATA: Evaluate for esophageal leak status post surgery.

EXAM:
ESOPHAGUS/BARIUM SWALLOW
TECHNIQUE: Single contrast examination was performed using thin liquid barium.
This exam was performed by Rahat Cowger, PA, and was supervised and
interpreted by Yeli Aboytes, M.D.
FLUOROSCOPY:
Radiation Exposure Index (as provided by the fluoroscopic device):
34.2 mGy

[Series 15: cp_standard · 0.29mm/px · 1 of 1 slices shown (1 of 8)]
[im 1/1]
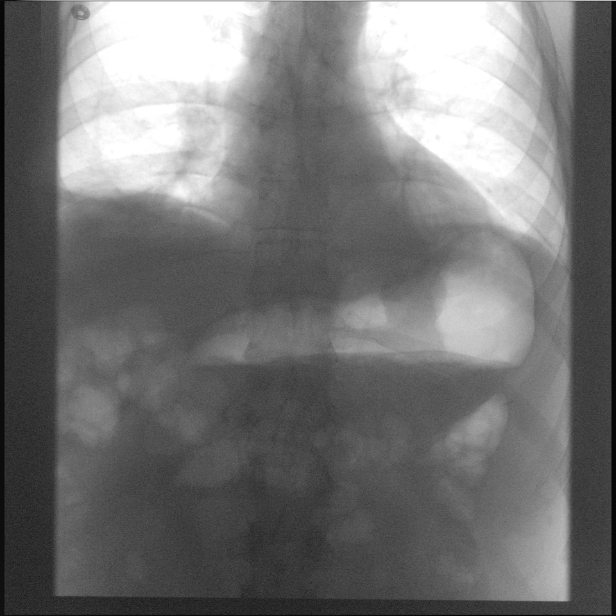

[Series 17: cp_standard · 0.19mm/px · 2 of 200 frames shown (2 of 8)]
[frame 31/200]
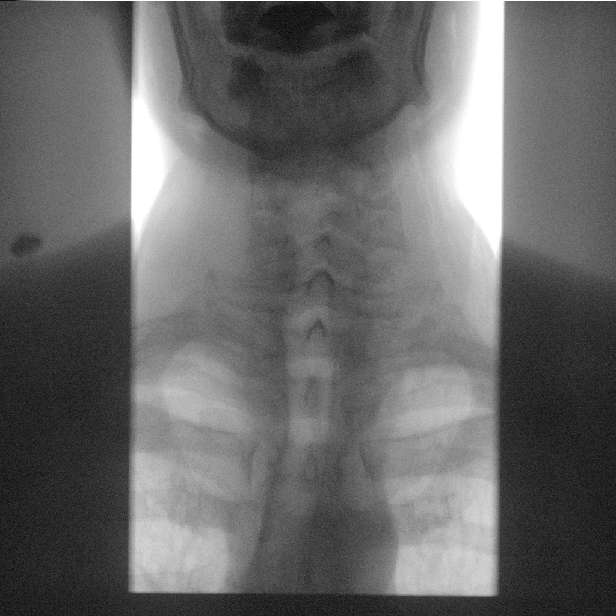
[frame 171/200]
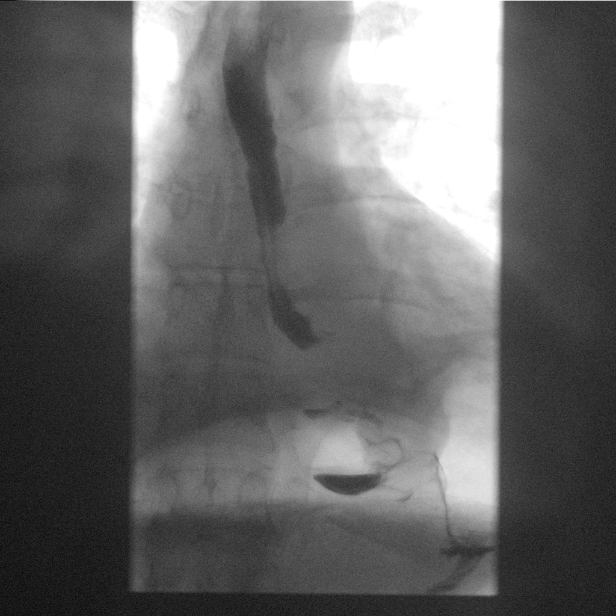

[Series 19: cp_standard · 0.28mm/px · 2 of 62 frames shown (3 of 8)]
[frame 23/62]
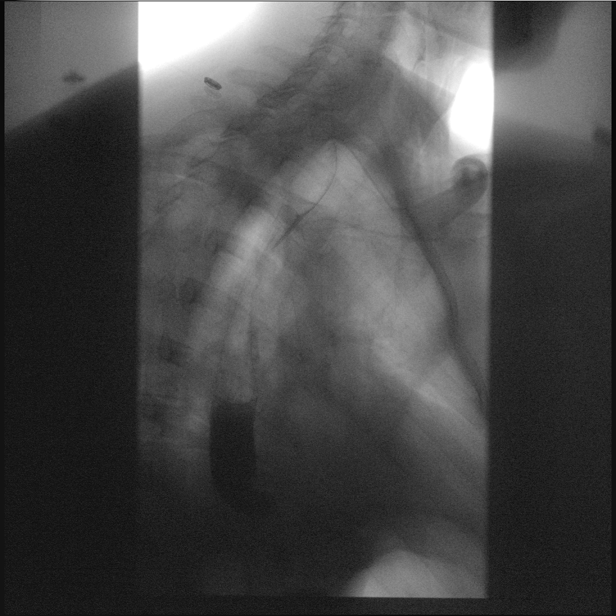
[frame 32/62]
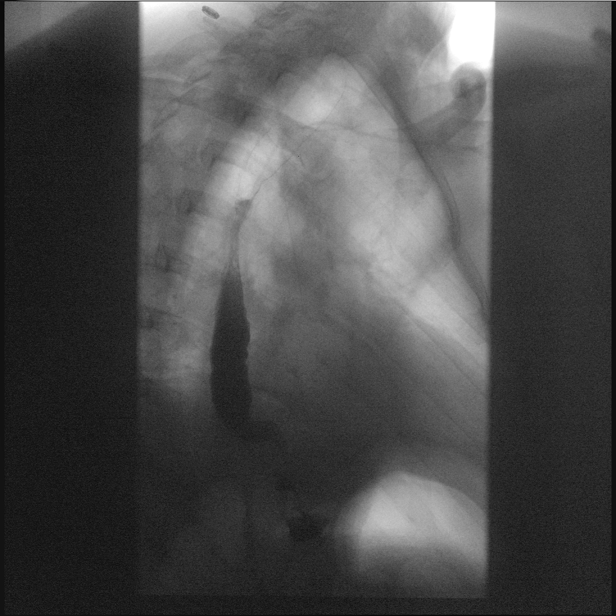

[Series 21: cp_standard · 0.28mm/px · 2 of 157 frames shown (4 of 8)]
[frame 24/157]
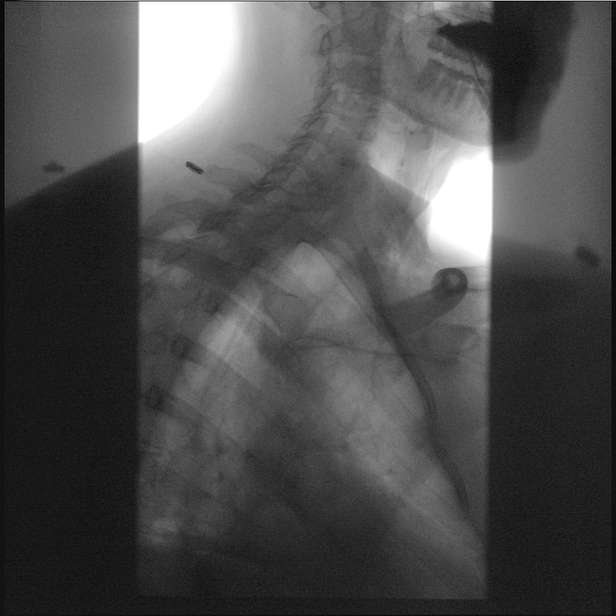
[frame 134/157]
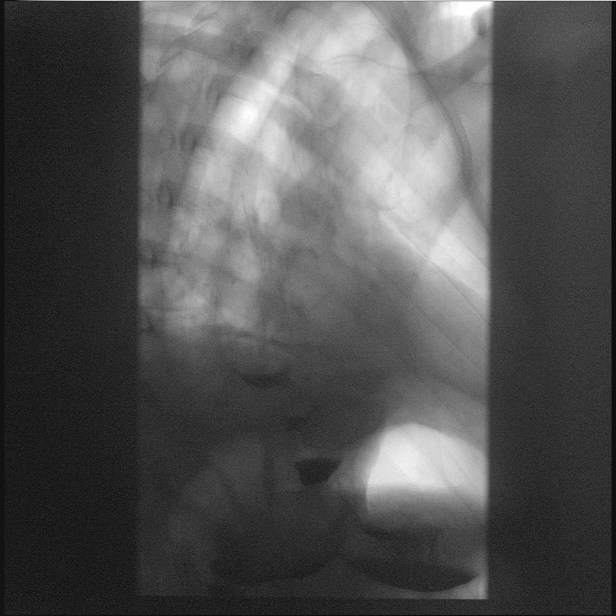

[Series 22: cp_standard · 0.29mm/px · 2 of 224 frames shown (5 of 8)]
[frame 6/224]
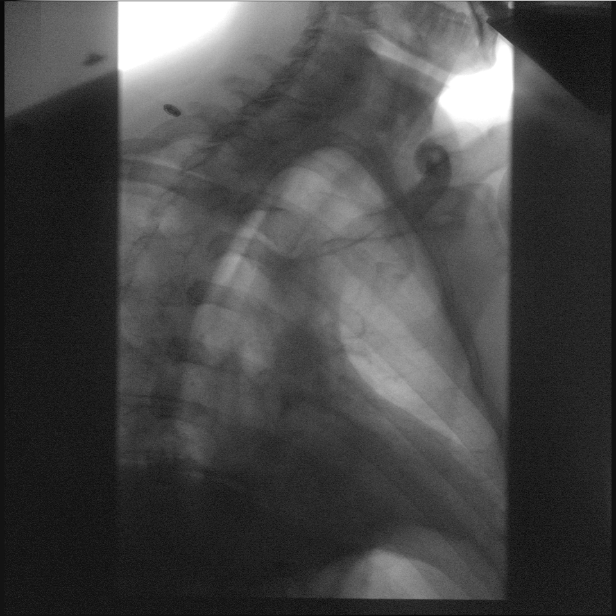
[frame 191/224]
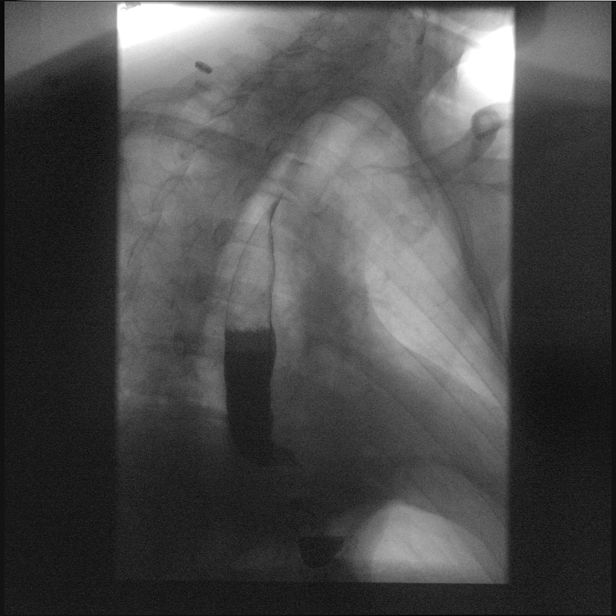

[Series 23: cp_standard · 0.28mm/px · 1 of 162 frames shown (6 of 8)]
[frame 82/162]
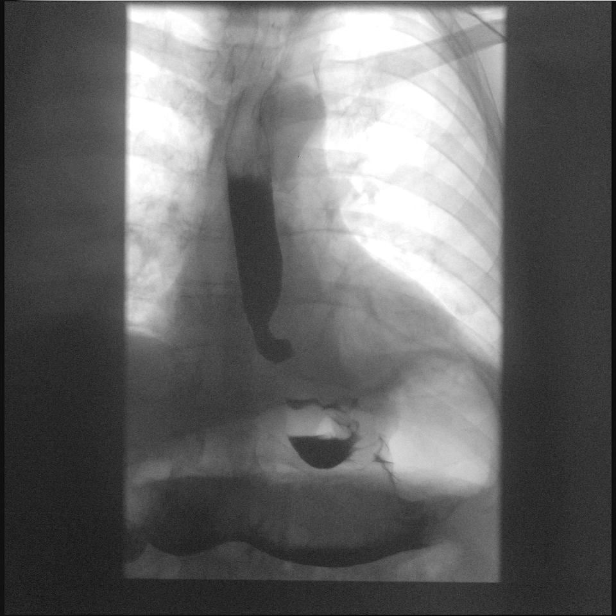

[Series 24: cp_standard · 0.29mm/px · 2 of 136 frames shown (7 of 8)]
[frame 21/136]
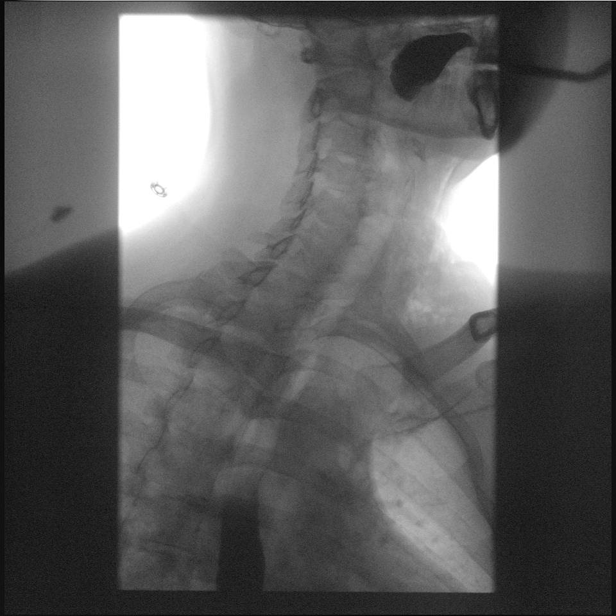
[frame 69/136]
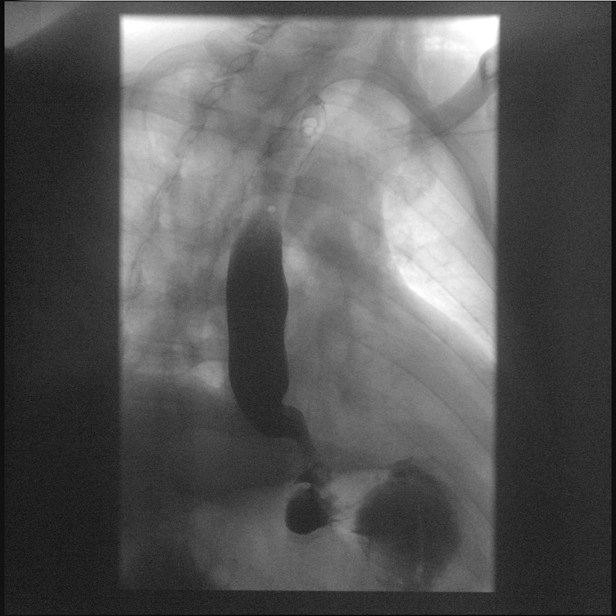

[Series 25: cp_standard · 0.29mm/px · 2 of 43 frames shown (8 of 8)]
[frame 1/43]
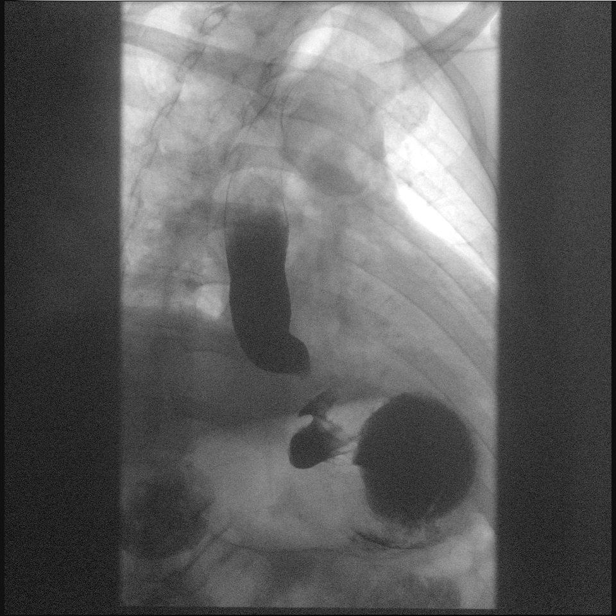
[frame 37/43]
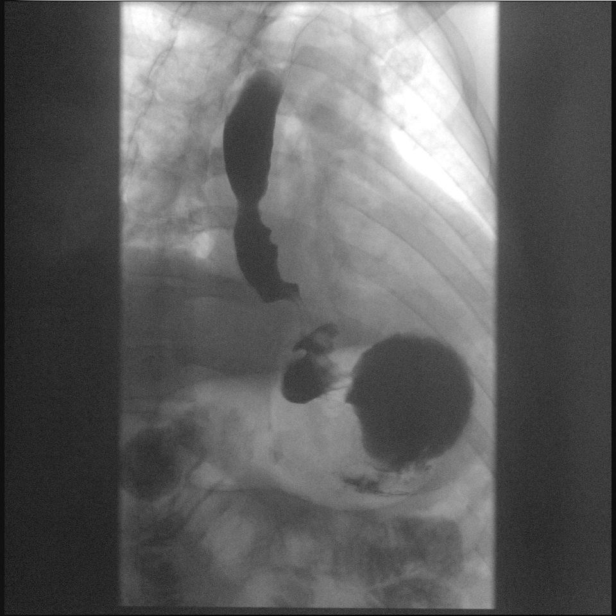

[14 of 24 positions shown; findings below may reference images not displayed]

FINDINGS: Normal pharyngeal anatomy and motility. Contrast flowed freely
through the esophagus without evidence of a mass. Smooth narrowing
of the esophagogastric junction partially obstructing flow of
contrast from the esophagus into the stomach. Tertiary contractions
of the distal third of the esophagus consistent with spasm. No
evidence of reflux. No definite hiatal hernia was demonstrated.

At the end of the examination a 13 mm barium tablet was administered
which transited through the esophagus and esophagogastric junction
without delay.
IMPRESSION: Smooth narrowing of the esophagogastric junction partially
obstructing flow of contrast from the esophagus into the stomach.
Tertiary contractions of the distal third of the esophagus
consistent with spasm.

## 2022-06-23 IMAGING — CT CT ABD-PELV W/ CM
2 of 4 series · 15 of 46 positions shown, 17 images · IV contrast (APPLIED)
Comparison: 10/21/2021

CLINICAL DATA: Abdominal pain, nausea, recent hernia surgery

EXAM:
CT ABDOMEN AND PELVIS WITH CONTRAST
TECHNIQUE: Multidetector CT imaging of the abdomen and pelvis was performed
using the standard protocol following bolus administration of
intravenous contrast.

[Series 2: abdomen 5.0 · axial · 0.74mm/px · z∈[+1079,+1524]mm · 12 of 101 slices shown, 14 images]
[im 6/101  soft-tissue]
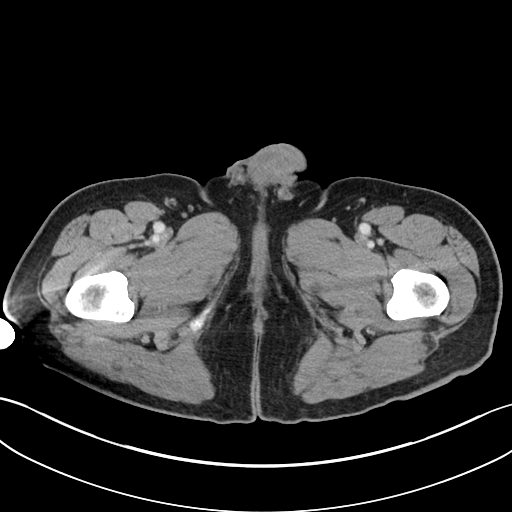
[im 6/101  bone]
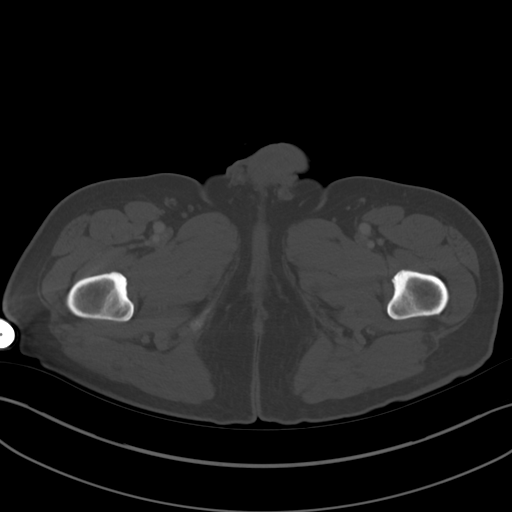
[im 18/101  soft-tissue]
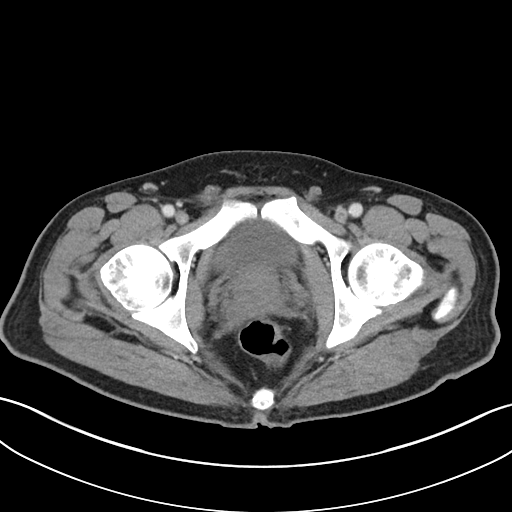
[im 24/101  soft-tissue]
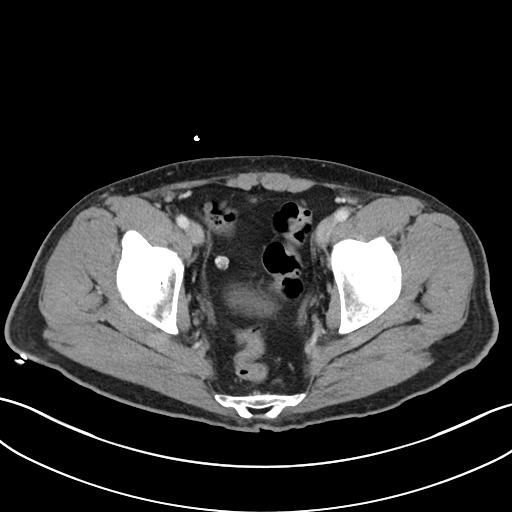
[im 30/101  soft-tissue]
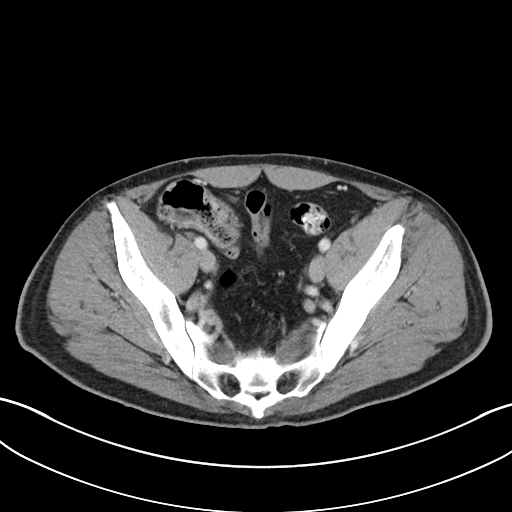
[im 42/101  soft-tissue]
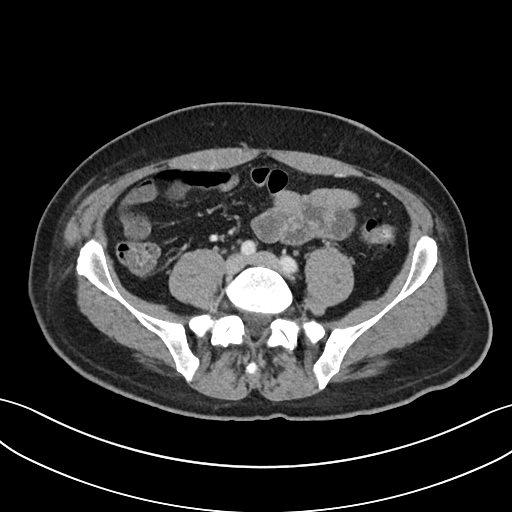
[im 48/101  soft-tissue]
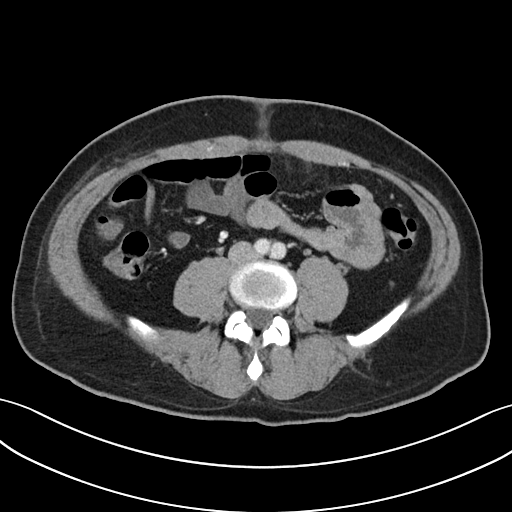
[im 53/101  soft-tissue]
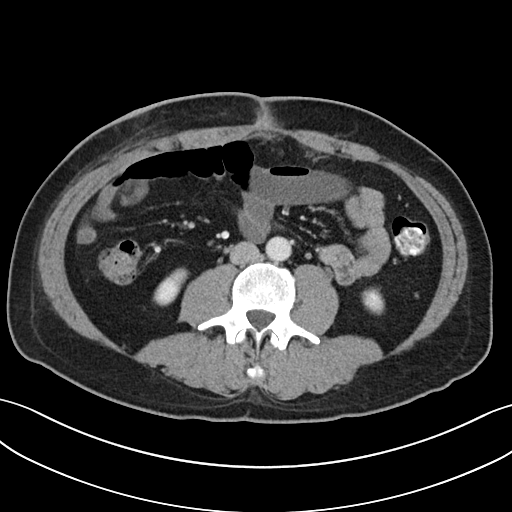
[im 65/101  soft-tissue]
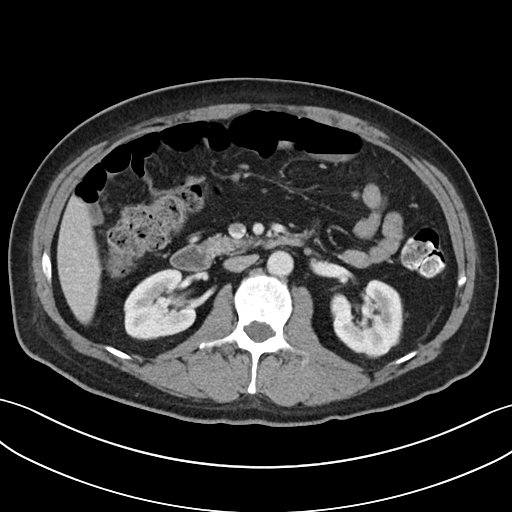
[im 71/101  soft-tissue]
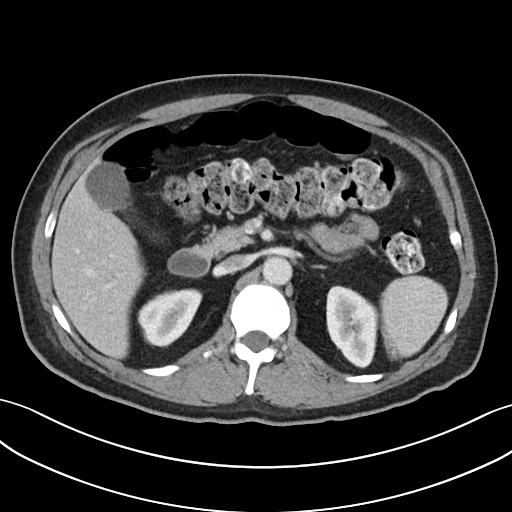
[im 71/101  bone]
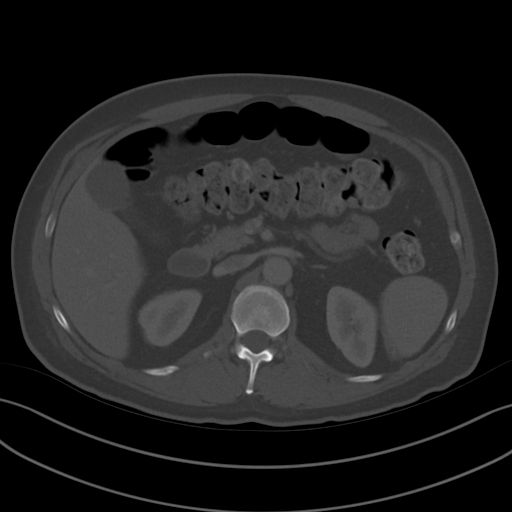
[im 77/101  soft-tissue]
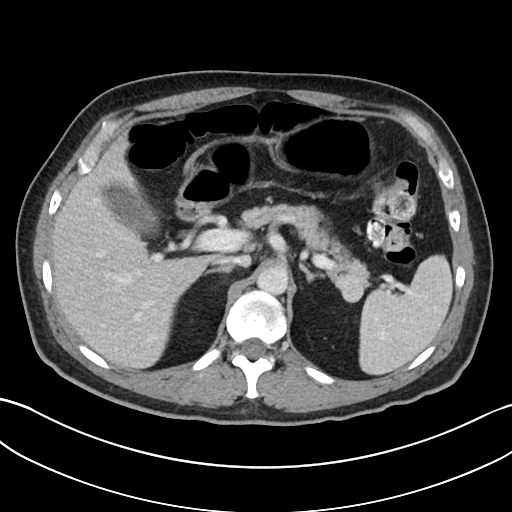
[im 89/101  soft-tissue]
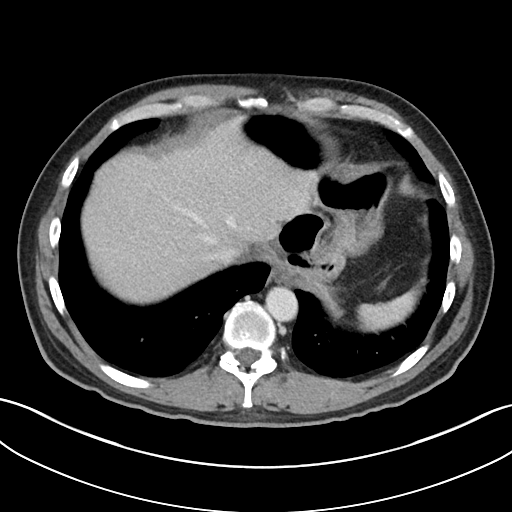
[im 95/101  soft-tissue]
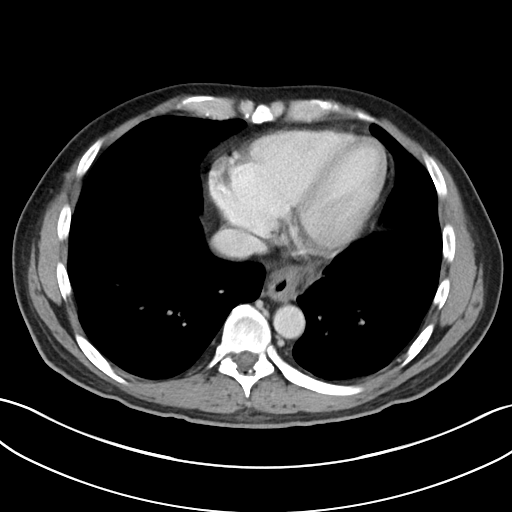

[Series 5: abdomen 3.0 mpr cor · coronal · 0.85mm/px · 3 of 92 slices shown]
[im 31/92  soft-tissue]
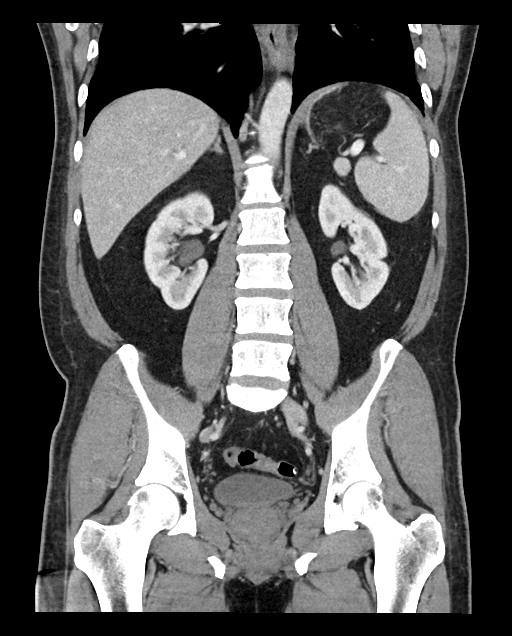
[im 41/92  soft-tissue]
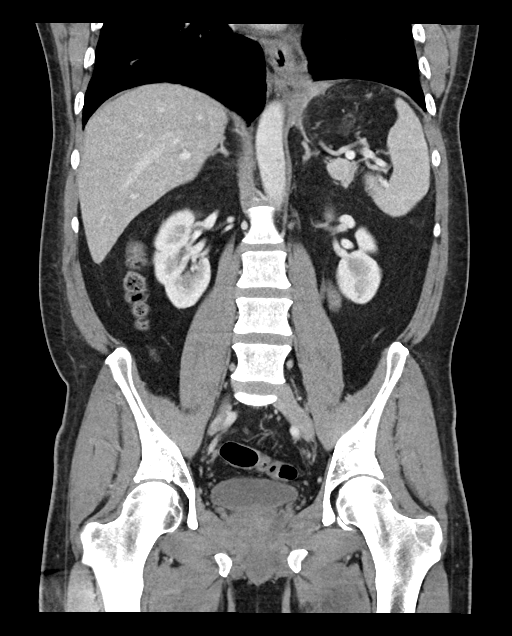
[im 51/92  soft-tissue]
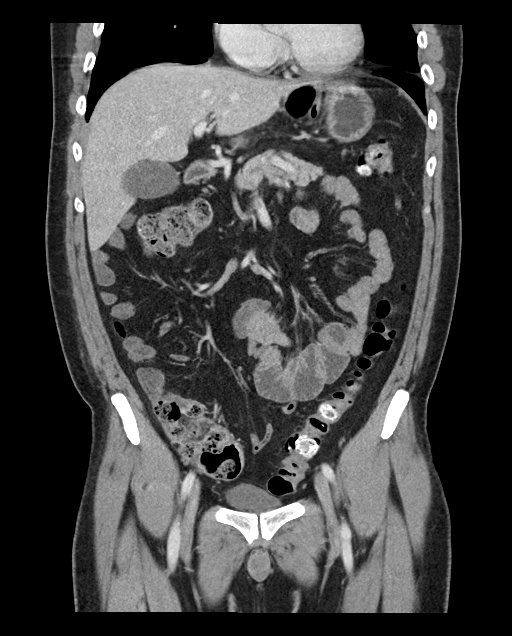

[15 of 46 positions shown; findings below may reference images not displayed]

RADIATION DOSE REDUCTION: This exam was performed according to the
departmental dose-optimization program which includes automated
exposure control, adjustment of the mA and/or kV according to
patient size and/or use of iterative reconstruction technique.

CONTRAST:  100mL OMNIPAQUE IOHEXOL 300 MG/ML  SOLN
FINDINGS: Lower chest: Evaluation of lower lung fields is limited by motion
artifacts. As far as seen there is no focal consolidation in the
lower lung fields. There is no pleural effusion.

Hepatobiliary: No focal abnormality is seen in the liver. There is
no dilation of bile ducts. Gallbladder is slightly distended without
wall thickening.

Pancreas: No focal abnormality is seen.

Spleen: Unremarkable.

Adrenals/Urinary Tract: Adrenals are not enlarged. There is no
hydronephrosis. There are no renal or ureteral stones. Urinary
bladder is not distended.

Stomach/Bowel: There is evidence of interval fundoplication.
Moderate hiatal hernia seen in the previous study is not evident in
the current study. Minimal stranding in the fat planes near the
gastroesophageal junction may be due to recent surgery. There is
mild dilation of few small bowel loops measuring up Angelo Branco 3.2 cm in
diameter. Dilated small bowel loops are lying anterior to the
transverse colon. Rest of the small bowel loops are unremarkable.
Appendix is not dilated.There is no significant wall thickening in
the colon. Scattered diverticula are seen in colon without signs of
focal diverticulitis.

Vascular/Lymphatic: Unremarkable.

Reproductive: Unremarkable.

Other: There is no ascites or pneumoperitoneum. There is minimal
stranding in the fat planes in the subcutaneous region of anterior
abdominal wall, possibly related to recent laparoscopic surgery.

Musculoskeletal: Unremarkable.
IMPRESSION: There is evidence of interval surgery for hiatal hernia. There is
mild dilation of some of the small bowel loops in the upper abdomen,
possibly suggesting ileus.

There is no evidence of intestinal obstruction or pneumoperitoneum.
There is no hydronephrosis. Appendix is not dilated. Few diverticula
are seen in colon without signs of focal acute diverticulitis.

Other findings as described in the body of the report.

## 2024-06-01 ENCOUNTER — Encounter: Payer: Self-pay | Admitting: Surgery
